# Patient Record
Sex: Male | Born: 1955 | Race: White | Hispanic: No | Marital: Single | Smoking: Current every day smoker
Health system: Southern US, Community
[De-identification: ages and names within clinical notes are randomized; demographics above are authoritative.]

## PROBLEM LIST (undated history)

## (undated) DIAGNOSIS — F419 Anxiety disorder, unspecified: Secondary | ICD-10-CM

## (undated) DIAGNOSIS — M549 Dorsalgia, unspecified: Secondary | ICD-10-CM

## (undated) DIAGNOSIS — H9190 Unspecified hearing loss, unspecified ear: Secondary | ICD-10-CM

## (undated) DIAGNOSIS — J302 Other seasonal allergic rhinitis: Secondary | ICD-10-CM

## (undated) DIAGNOSIS — G43909 Migraine, unspecified, not intractable, without status migrainosus: Secondary | ICD-10-CM

## (undated) DIAGNOSIS — I10 Essential (primary) hypertension: Secondary | ICD-10-CM

## (undated) HISTORY — PX: KNEE SURGERY: SHX244

## (undated) HISTORY — PX: FOOT FRACTURE SURGERY: SHX645

## (undated) HISTORY — PX: FACIAL FRACTURE SURGERY: SHX1570

---

## 1999-02-22 ENCOUNTER — Emergency Department (HOSPITAL_COMMUNITY): Admission: EM | Admit: 1999-02-22 | Discharge: 1999-02-22 | Payer: Self-pay | Admitting: Emergency Medicine

## 1999-03-27 ENCOUNTER — Emergency Department (HOSPITAL_COMMUNITY): Admission: EM | Admit: 1999-03-27 | Discharge: 1999-03-28 | Payer: Self-pay | Admitting: Emergency Medicine

## 2000-02-10 ENCOUNTER — Emergency Department (HOSPITAL_COMMUNITY): Admission: EM | Admit: 2000-02-10 | Discharge: 2000-02-10 | Payer: Self-pay | Admitting: Emergency Medicine

## 2000-03-26 ENCOUNTER — Encounter: Payer: Self-pay | Admitting: Emergency Medicine

## 2000-03-26 ENCOUNTER — Emergency Department (HOSPITAL_COMMUNITY): Admission: EM | Admit: 2000-03-26 | Discharge: 2000-03-26 | Payer: Self-pay | Admitting: Emergency Medicine

## 2000-05-20 ENCOUNTER — Emergency Department (HOSPITAL_COMMUNITY): Admission: EM | Admit: 2000-05-20 | Discharge: 2000-05-20 | Payer: Self-pay | Admitting: Emergency Medicine

## 2003-01-20 ENCOUNTER — Emergency Department (HOSPITAL_COMMUNITY): Admission: EM | Admit: 2003-01-20 | Discharge: 2003-01-20 | Payer: Self-pay | Admitting: Emergency Medicine

## 2003-12-17 ENCOUNTER — Emergency Department (HOSPITAL_COMMUNITY): Admission: EM | Admit: 2003-12-17 | Discharge: 2003-12-17 | Payer: Self-pay | Admitting: Emergency Medicine

## 2004-07-08 ENCOUNTER — Emergency Department (HOSPITAL_COMMUNITY): Admission: EM | Admit: 2004-07-08 | Discharge: 2004-07-08 | Payer: Self-pay | Admitting: Emergency Medicine

## 2005-08-10 ENCOUNTER — Emergency Department (HOSPITAL_COMMUNITY): Admission: EM | Admit: 2005-08-10 | Discharge: 2005-08-10 | Payer: Self-pay | Admitting: Emergency Medicine

## 2005-09-04 ENCOUNTER — Emergency Department: Payer: Self-pay | Admitting: Emergency Medicine

## 2006-12-17 HISTORY — PX: FRACTURE SURGERY: SHX138

## 2007-02-04 ENCOUNTER — Emergency Department (HOSPITAL_COMMUNITY): Admission: EM | Admit: 2007-02-04 | Discharge: 2007-02-04 | Payer: Self-pay | Admitting: Emergency Medicine

## 2007-02-07 ENCOUNTER — Encounter (HOSPITAL_COMMUNITY): Admission: RE | Admit: 2007-02-07 | Discharge: 2007-02-07 | Payer: Self-pay | Admitting: Emergency Medicine

## 2009-10-24 ENCOUNTER — Emergency Department (HOSPITAL_COMMUNITY): Admission: EM | Admit: 2009-10-24 | Discharge: 2009-10-24 | Payer: Self-pay | Admitting: Emergency Medicine

## 2011-03-14 ENCOUNTER — Emergency Department (HOSPITAL_COMMUNITY)
Admission: EM | Admit: 2011-03-14 | Discharge: 2011-03-14 | Disposition: A | Discharge status: Home / Self Care | Payer: Self-pay | Attending: Emergency Medicine | Admitting: Emergency Medicine

## 2011-03-14 DIAGNOSIS — M545 Low back pain, unspecified: Secondary | ICD-10-CM | POA: Insufficient documentation

## 2011-03-14 DIAGNOSIS — M549 Dorsalgia, unspecified: Secondary | ICD-10-CM | POA: Insufficient documentation

## 2012-10-14 ENCOUNTER — Emergency Department (HOSPITAL_COMMUNITY)
Admission: EM | Admit: 2012-10-14 | Discharge: 2012-10-14 | Disposition: A | Discharge status: Home / Self Care | Payer: Self-pay | Attending: Emergency Medicine | Admitting: Emergency Medicine

## 2012-10-14 ENCOUNTER — Encounter (HOSPITAL_COMMUNITY): Payer: Self-pay | Admitting: *Deleted

## 2012-10-14 DIAGNOSIS — F17200 Nicotine dependence, unspecified, uncomplicated: Secondary | ICD-10-CM | POA: Insufficient documentation

## 2012-10-14 DIAGNOSIS — Z9889 Other specified postprocedural states: Secondary | ICD-10-CM | POA: Insufficient documentation

## 2012-10-14 DIAGNOSIS — Y929 Unspecified place or not applicable: Secondary | ICD-10-CM | POA: Insufficient documentation

## 2012-10-14 DIAGNOSIS — S93409A Sprain of unspecified ligament of unspecified ankle, initial encounter: Secondary | ICD-10-CM | POA: Insufficient documentation

## 2012-10-14 DIAGNOSIS — X500XXA Overexertion from strenuous movement or load, initial encounter: Secondary | ICD-10-CM | POA: Insufficient documentation

## 2012-10-14 DIAGNOSIS — S93402A Sprain of unspecified ligament of left ankle, initial encounter: Secondary | ICD-10-CM

## 2012-10-14 DIAGNOSIS — Y939 Activity, unspecified: Secondary | ICD-10-CM | POA: Insufficient documentation

## 2012-10-14 MED ORDER — NAPROXEN 500 MG PO TABS: 500.0000 mg | ORAL_TABLET | Freq: Two times a day (BID) | ORAL | Status: DC

## 2012-10-14 NOTE — ED Provider Notes (Signed)
History     CSN: [id number]  Arrival date & time 10/14/12  1021   First MD Initiated Contact with Patient 10/14/12 1057      Chief Complaint  Patient presents with  . Ankle Pain    (Consider location/radiation/quality/duration/timing/severity/associated sxs/prior treatment) Patient is a 56 y.o. male presenting with ankle pain. The history is provided by the patient.  Ankle Pain  The incident occurred more than 2 days ago. The injury mechanism was torsion. The pain is present in the left ankle. The quality of the pain is described as aching. The pain is at a severity of 3/10. The pain is mild. The pain has been constant since onset. Pertinent negatives include no numbness, no inability to bear weight, no loss of motion, no muscle weakness, no loss of sensation and no tingling.  PT states twisted his left ankle 3 days ago. States pain, swelling, bruising. Was told by his job to get a clearance for work. Pt states hx of fracture on that ankle, had surgical repair. States pain with walking on it. Tried ice packs with some improvement.   History reviewed. No pertinent past medical history.  Past Surgical History  Procedure Date  . Fracture surgery 2008    L foot fx    No family history on file.  History  Substance Use Topics  . Smoking status: Current Every Day Smoker -- 1.0 packs/day    Types: Cigarettes  . Smokeless tobacco: Not on file  . Alcohol Use: No      Review of Systems  Constitutional: Negative for fever and chills.  Musculoskeletal: Positive for joint swelling and arthralgias. Negative for gait problem.  Neurological: Negative for tingling, weakness and numbness.    Allergies  Review of patient's allergies indicates no known allergies.  Home Medications   Current Outpatient Rx  Name Route Sig Dispense Refill  . ACETAMINOPHEN 500 MG PO TABS Oral Take 1,000 mg by mouth every 6 (six) hours as needed. pain      BP 116/73  Pulse 52  Temp 98 F (36.7 C)  (Oral)  Resp 18  SpO2 100%  Physical Exam  Nursing note and vitals reviewed. Constitutional: He is oriented to person, place, and time. He appears well-developed and well-nourished.  HENT:  Head: Normocephalic.  Eyes: Conjunctivae normal are normal.  Neck: Neck supple.  Cardiovascular: Normal rate, regular rhythm and normal heart sounds.   Pulmonary/Chest: Effort normal and breath sounds normal. No respiratory distress. He has no wheezes. He has no rales.  Musculoskeletal:       Left ankle mild swelling over lateral malleolus. Bruising noted over lateral malleolus. No tenderness with palpation. Full ROM. Joint stable.   Neurological: He is alert and oriented to person, place, and time.  Skin: Skin is warm and dry.  Psychiatric: He has a normal mood and affect.    ED Course  Procedures (including critical care time)  Left ankle sprain based on exam. No tenderness. Pt refusing x-ray, saying, "i know it is not broken" just wants a note to go back to work. Explained to him that i am unable to say for sure he does not have a fracture, he understands and still refuses x-ray. Pt is ambulatory, joint stable, non tender. Will d/c home with OK to go back to work as tolerates. Follow up with orthopedics as needed.    1. Sprain of ankle, left       MDM  Lottie Mussel, PA 10/14/12 1555

## 2012-10-14 NOTE — ED Notes (Signed)
Pt reports slipping on a board Friday, twisting his L ankle.  Pt reports that he's using cold compress to ankle.  Pt states "it's not broke i can tell you that."  But reports pain with weight bearing.

## 2012-10-14 NOTE — ED Notes (Signed)
Pt reports L ankle pain.  Twisting it Friday d/t fall.  No swelling noted at this time.  Pt reports pain is worse with weight bearing.  Pt's HR is also low at 52-pt reports this is normal for him.  Pt denies any lightheadedness, dizziness or cp or SOB at this time.

## 2012-10-18 NOTE — ED Provider Notes (Signed)
Medical screening examination/treatment/procedure(s) were performed by non-physician practitioner and as supervising physician I was immediately available for consultation/collaboration.   Suzi Roots, MD 10/18/12 [phone]

## 2014-08-07 ENCOUNTER — Encounter (HOSPITAL_COMMUNITY): Payer: Self-pay | Admitting: Emergency Medicine

## 2014-08-07 ENCOUNTER — Emergency Department (HOSPITAL_COMMUNITY)
Admission: EM | Admit: 2014-08-07 | Discharge: 2014-08-07 | Disposition: A | Discharge status: Home / Self Care | Payer: Self-pay | Attending: Emergency Medicine | Admitting: Emergency Medicine

## 2014-08-07 DIAGNOSIS — E860 Dehydration: Secondary | ICD-10-CM | POA: Insufficient documentation

## 2014-08-07 DIAGNOSIS — Z880 Allergy status to penicillin: Secondary | ICD-10-CM | POA: Insufficient documentation

## 2014-08-07 DIAGNOSIS — F17200 Nicotine dependence, unspecified, uncomplicated: Secondary | ICD-10-CM | POA: Insufficient documentation

## 2014-08-07 DIAGNOSIS — R112 Nausea with vomiting, unspecified: Secondary | ICD-10-CM | POA: Insufficient documentation

## 2014-08-07 LAB — URINALYSIS, ROUTINE W REFLEX MICROSCOPIC
Glucose, UA: NEGATIVE mg/dL
Ketones, ur: 15 mg/dL — AB
LEUKOCYTES UA: NEGATIVE
Nitrite: NEGATIVE
PROTEIN: NEGATIVE mg/dL
Specific Gravity, Urine: 1.038 — ABNORMAL HIGH (ref 1.005–1.030)
UROBILINOGEN UA: 0.2 mg/dL (ref 0.0–1.0)
pH: 5.5 (ref 5.0–8.0)

## 2014-08-07 LAB — CBC WITH DIFFERENTIAL/PLATELET
Basophils Absolute: 0 10*3/uL (ref 0.0–0.1)
Basophils Relative: 0 % (ref 0–1)
EOS ABS: 0 10*3/uL (ref 0.0–0.7)
EOS PCT: 0 % (ref 0–5)
HCT: 39.6 % (ref 39.0–52.0)
HEMOGLOBIN: 13.9 g/dL (ref 13.0–17.0)
LYMPHS ABS: 2 10*3/uL (ref 0.7–4.0)
Lymphocytes Relative: 28 % (ref 12–46)
MCH: 38.1 pg — AB (ref 26.0–34.0)
MCHC: 35.1 g/dL (ref 30.0–36.0)
MCV: 108.5 fL — ABNORMAL HIGH (ref 78.0–100.0)
MONOS PCT: 8 % (ref 3–12)
Monocytes Absolute: 0.5 10*3/uL (ref 0.1–1.0)
Neutro Abs: 4.6 10*3/uL (ref 1.7–7.7)
Neutrophils Relative %: 64 % (ref 43–77)
Platelets: 191 10*3/uL (ref 150–400)
RBC: 3.65 MIL/uL — AB (ref 4.22–5.81)
RDW: 14 % (ref 11.5–15.5)
WBC: 7.1 10*3/uL (ref 4.0–10.5)

## 2014-08-07 LAB — COMPREHENSIVE METABOLIC PANEL
ALT: 13 U/L (ref 0–53)
AST: 18 U/L (ref 0–37)
Albumin: 4.7 g/dL (ref 3.5–5.2)
Alkaline Phosphatase: 85 U/L (ref 39–117)
Anion gap: 17 — ABNORMAL HIGH (ref 5–15)
BUN: 25 mg/dL — AB (ref 6–23)
CALCIUM: 9.9 mg/dL (ref 8.4–10.5)
CO2: 26 mEq/L (ref 19–32)
Chloride: 100 mEq/L (ref 96–112)
Creatinine, Ser: 0.88 mg/dL (ref 0.50–1.35)
GFR calc non Af Amer: >90 mL/min (ref >90)
GLUCOSE: 111 mg/dL — AB (ref 70–99)
Potassium: 3.7 mEq/L (ref 3.7–5.3)
Sodium: 143 mEq/L (ref 137–147)
TOTAL PROTEIN: 7.7 g/dL (ref 6.0–8.3)
Total Bilirubin: 0.5 mg/dL (ref 0.3–1.2)

## 2014-08-07 LAB — URINE MICROSCOPIC-ADD ON

## 2014-08-07 LAB — LIPASE, BLOOD: Lipase: 29 U/L (ref 11–59)

## 2014-08-07 MED ORDER — SODIUM CHLORIDE 0.9 % IV BOLUS (SEPSIS)
1000.0000 mL | Freq: Once | INTRAVENOUS | Status: AC
  Administered 2014-08-07: 1000 mL via INTRAVENOUS

## 2014-08-07 MED ORDER — ONDANSETRON 4 MG PO TBDP: 4.0000 mg | ORAL_TABLET | Freq: Three times a day (TID) | ORAL | Status: DC | PRN

## 2014-08-07 NOTE — ED Provider Notes (Signed)
CSN: [id number]     Arrival date & time 08/07/14  1749 History   First MD Initiated Contact with Patient 08/07/14 2026     Chief Complaint  Patient presents with  . Fatigue  . Dizziness  . Nausea  . Emesis     (Consider location/radiation/quality/duration/timing/severity/associated sxs/prior Treatment) HPI Comments: Patient with no significant past medical history presents with complaint of nausea, vomiting, fatigue over the past 2 days. Patient states that the symptoms began yesterday morning. He was at work last evening and began having multiple episodes of vomiting which persisted overnight. He was unable to keep down solids or liquids. Patient has been able to drink some water today without vomiting. He did not have any pain with these symptoms in his chest or his abdomen. He did not have any bowel movements or diarrhea. Urination is normal. No other medical complaints. No treatments prior to arrival. Patient works in a very hot environment. 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 He states that he drinks a lot of water but has trouble staying hydrated because it is so hot. Last alcohol use was 1/2 shot one week ago. Denies heavy NSAID use. The onset of this condition was acute. The course is constant. Aggravating factors: none. Alleviating factors: none.    Patient is a 58 y.o. male presenting with dizziness and vomiting. The history is provided by the patient.  Dizziness Associated symptoms: nausea and vomiting   Associated symptoms: no chest pain, no diarrhea, no headaches and no shortness of breath   Emesis Associated symptoms: no abdominal pain, no diarrhea, no headaches, no myalgias and no sore throat     History reviewed. No pertinent past medical history. Past Surgical History  Procedure Laterality Date  . Fracture surgery  2008    L foot fx   No family history on file. History  Substance Use Topics  . Smoking  status: Current Every Day Smoker -- 1.00 packs/day    Types: Cigarettes  . Smokeless tobacco: Not on file  . Alcohol Use: No    Review of Systems  Constitutional: Negative for fever.  HENT: Negative for rhinorrhea and sore throat.   Eyes: Negative for redness.  Respiratory: Negative for cough and shortness of breath.   Cardiovascular: Negative for chest pain and leg swelling.  Gastrointestinal: Positive for nausea and vomiting. Negative for abdominal pain and diarrhea.  Genitourinary: Negative for dysuria.  Musculoskeletal: Negative for myalgias.  Skin: Negative for rash.  Neurological: Negative for dizziness and headaches.   Allergies  Penicillins  Home Medications   Prior to Admission medications   Medication Sig Start Date End Date Taking? Authorizing Provider  ALPRAZolam Prudy Feeler) 0.25 MG tablet Take 0.5 mg by mouth at bedtime as needed for anxiety or sleep.   Yes Historical Provider, MD  diphenhydramine-acetaminophen (TYLENOL PM) 25-500 MG TABS Take 1 tablet by mouth at bedtime as needed (pain/sleep).   Yes Historical Provider, MD   BP 123/83  Pulse 86  Temp(Src) 98.4 F (36.9 C) (Oral)  Resp 16  Ht 6\' 1"  (1.854 m)  Wt 140 lb (63.504 kg)  BMI 18.47 kg/m2  SpO2 98%  Physical Exam  Nursing note and vitals reviewed. Constitutional: He appears well-developed and well-nourished.  HENT:  Head: Normocephalic and atraumatic.  Mouth/Throat: Mucous membranes are dry.  Eyes: Conjunctivae are normal. Right eye exhibits no discharge. Left eye exhibits no discharge.  Neck: Normal range of motion.  Neck supple.  Cardiovascular: Normal rate, regular rhythm and intact distal pulses.   No murmur heard. Pulmonary/Chest: Effort normal and breath sounds normal. No respiratory distress. He has no wheezes. He has no rales.  Abdominal: Soft. Bowel sounds are normal. There is no tenderness. There is no rebound and no guarding.  Neurological: He is alert.  Skin: Skin is warm and dry.   Psychiatric: He has a normal mood and affect.    ED Course  Procedures (including critical care time) Labs Review Labs Reviewed  CBC WITH DIFFERENTIAL - Abnormal; Notable for the following:    RBC 3.65 (*)    MCV 108.5 (*)    MCH 38.1 (*)    All other components within normal limits  COMPREHENSIVE METABOLIC PANEL - Abnormal; Notable for the following:    Glucose, Bld 111 (*)    BUN 25 (*)    Anion gap 17 (*)    All other components within normal limits  URINALYSIS, ROUTINE W REFLEX MICROSCOPIC - Abnormal; Notable for the following:    Color, Urine AMBER (*)    APPearance CLOUDY (*)    Specific Gravity, Urine 1.038 (*)    Hgb urine dipstick TRACE (*)    Bilirubin Urine SMALL (*)    Ketones, ur 15 (*)    All other components within normal limits  URINE MICROSCOPIC-ADD ON - Abnormal; Notable for the following:    Casts HYALINE CASTS (*)    All other components within normal limits  LIPASE, BLOOD    Imaging Review No results found.   EKG Interpretation None      9:08 PM Patient seen and examined. Work-up initiated. Fluids ordered. Considered imaging however no risk factors for obstruction or abdominal/chest pain.   Vital signs reviewed and are as follows: BP 123/83  Pulse 86  Temp(Src) 98.4 F (36.9 C) (Oral)  Resp 16  Ht 6\' 1"  (1.854 m)  Wt 140 lb (63.504 kg)  BMI 18.47 kg/m2  SpO2 98%  11:14 PM Patient feels much better after 2L NS. His UA suggests dehydration. He is tolerating PO fluids as well. Abd remains soft and NT. I feel the patient is safe to go home. I have encouraged patient to increase his fluid intake and rest for the next 2 days.  Patient urged to return with worsening symptoms or other concerns. Patient verbalized understanding and agrees with plan.    MDM   Final diagnoses:  Dehydration   Patient with N/V, fatigue likely 2/2 heat exhaustion-type illness. Patient is improved in emergency department after treatment. Labs suggest dehydration.  Patient does not have any chest pain or abdominal pain to suggest a more serious etiology. Patient appears well and is more energetic at time of discharge. He is comfortable with discharge home.  No dangerous or life-threatening conditions suspected or identified by history, physical exam, and by work-up. No indications for hospitalization identified.      Renne CriglerJoshua Shammara Jarrett, PA-C 08/07/14 2317

## 2014-08-07 NOTE — ED Notes (Signed)
Patient states he has been having N/V and overall weakness the last 2 days. Patient states this all come on at one time. Patient denies pain and has not taken anything for these symptoms.

## 2014-08-07 NOTE — Discharge Instructions (Signed)
Please read and follow all provided instructions.  Your diagnoses today include:  1. Dehydration     Tests performed today include:  Blood counts and electrolytes  Urine test - shows dehydration  Vital signs. See below for your results today.   Medications prescribed:   Zofran (ondansetron) - for nausea and vomiting  Take any prescribed medications only as directed.  Home care instructions:  Follow any educational materials contained in this packet.  BE VERY CAREFUL not to take multiple medicines containing Tylenol (also called acetaminophen). Doing so can lead to an overdose which can damage your liver and cause liver failure and possibly death.   Follow-up instructions: Please follow-up with your primary care provider in the next 3 days for further evaluation of your symptoms.   Double your fluid intake for the next 3 days.   Return instructions:   Please return to the Emergency Department if you experience worsening symptoms.   Please return if you have any other emergent concerns.  Additional Information:  Your vital signs today were: BP 149/84   Pulse 69   Temp(Src) 98 F (36.7 C) (Oral)   Resp 16   Ht 6\' 1"  (1.854 m)   Wt 140 lb (63.504 kg)   BMI 18.47 kg/m2   SpO2 100% If your blood pressure (BP) was elevated above 135/85 this visit, please have this repeated by your doctor within one month. --------------

## 2014-08-07 NOTE — ED Provider Notes (Signed)
Medical screening examination/treatment/procedure(s) were performed by non-physician practitioner and as supervising physician I was immediately available for consultation/collaboration.   EKG Interpretation None        Jaivion Kingsley, MD 08/07/14 2352 

## 2015-03-08 ENCOUNTER — Encounter (HOSPITAL_BASED_OUTPATIENT_CLINIC_OR_DEPARTMENT_OTHER): Payer: Self-pay | Admitting: *Deleted

## 2015-03-08 ENCOUNTER — Emergency Department (HOSPITAL_BASED_OUTPATIENT_CLINIC_OR_DEPARTMENT_OTHER)
Admission: EM | Admit: 2015-03-08 | Discharge: 2015-03-08 | Disposition: A | Discharge status: Home / Self Care | Payer: Self-pay | Attending: Emergency Medicine | Admitting: Emergency Medicine

## 2015-03-08 DIAGNOSIS — K047 Periapical abscess without sinus: Secondary | ICD-10-CM | POA: Insufficient documentation

## 2015-03-08 DIAGNOSIS — K088 Other specified disorders of teeth and supporting structures: Secondary | ICD-10-CM | POA: Insufficient documentation

## 2015-03-08 DIAGNOSIS — Z720 Tobacco use: Secondary | ICD-10-CM | POA: Insufficient documentation

## 2015-03-08 DIAGNOSIS — Z792 Long term (current) use of antibiotics: Secondary | ICD-10-CM | POA: Insufficient documentation

## 2015-03-08 DIAGNOSIS — K0889 Other specified disorders of teeth and supporting structures: Secondary | ICD-10-CM

## 2015-03-08 DIAGNOSIS — Z79899 Other long term (current) drug therapy: Secondary | ICD-10-CM | POA: Insufficient documentation

## 2015-03-08 DIAGNOSIS — Z880 Allergy status to penicillin: Secondary | ICD-10-CM | POA: Insufficient documentation

## 2015-03-08 MED ORDER — CLINDAMYCIN HCL 300 MG PO CAPS: 300.0000 mg | ORAL_CAPSULE | Freq: Four times a day (QID) | ORAL | Status: DC

## 2015-03-08 MED ORDER — IBUPROFEN 800 MG PO TABS: 800.0000 mg | ORAL_TABLET | Freq: Three times a day (TID) | ORAL | Status: DC

## 2015-03-08 NOTE — ED Notes (Signed)
Pt amb to room 10 with quick steady gait in nad. Pt states he knows he has 6 "rotten teeth" that "need to come out". Pt states he called dentist this am and was told to come to ed. Pt reports left lower tooth has increased pain "pressure" that is causing a headache.

## 2015-03-08 NOTE — ED Provider Notes (Signed)
CSN: [id number]     Arrival date & time 03/08/15  1104 History   First MD Initiated Contact with Patient 03/08/15 1208     Chief Complaint  Patient presents with  . Dental Pain     (Consider location/radiation/quality/duration/timing/severity/associated sxs/prior Treatment) HPI Comments: 59 y/o M complaining of dental pain "for a while", worsening over the past 2 days. Reports he has 6 "rotten teeth" the need to come out, however he is unable to afford it at the time he saw the dentist. He tried calling his dentist office this morning, and was told his dentist will be back in the country April 1, and in the meantime to go to the ED. States he feels pressure behind the tooth that is hurting, worse when he presses on the area. No alleviating factors. States it is slightly swollen. Denies fevers.  Patient is a 59 y.o. male presenting with tooth pain. The history is provided by the patient.  Dental Pain   History reviewed. No pertinent past medical history. Past Surgical History  Procedure Laterality Date  . Fracture surgery  2008    L foot fx   History reviewed. No pertinent family history. History  Substance Use Topics  . Smoking status: Current Every Day Smoker -- 1.00 packs/day    Types: Cigarettes  . Smokeless tobacco: Not on file  . Alcohol Use: No    Review of Systems  HENT: Positive for dental problem.   All other systems reviewed and are negative.     Allergies  Penicillins  Home Medications   Prior to Admission medications   Medication Sig Start Date End Date Taking? Authorizing Provider  ALPRAZolam Prudy Feeler(XANAX) 0.25 MG tablet Take 0.5 mg by mouth at bedtime as needed for anxiety or sleep.    Historical Provider, MD  clindamycin (CLEOCIN) 300 MG capsule Take 1 capsule (300 mg total) by mouth 4 (four) times daily. X 7 days 03/08/15   Kathrynn Speedobyn M Zhaniya Swallows, PA-C  diphenhydramine-acetaminophen (TYLENOL PM) 25-500 MG TABS Take 1 tablet by mouth at bedtime as needed (pain/sleep).     Historical Provider, MD  ibuprofen (ADVIL,MOTRIN) 800 MG tablet Take 1 tablet (800 mg total) by mouth 3 (three) times daily. 03/08/15   Nada Boozerobyn M Emberly Tomasso, PA-C  ondansetron (ZOFRAN ODT) 4 MG disintegrating tablet Take 1 tablet (4 mg total) by mouth every 8 (eight) hours as needed for nausea or vomiting. 08/07/14   Renne CriglerJoshua Geiple, PA-C   BP 153/92 mmHg  Pulse 84  Temp(Src) 98.4 F (36.9 C) (Oral)  Resp 18  Ht 6\' 1"  (1.854 m)  Wt 140 lb (63.504 kg)  BMI 18.47 kg/m2  SpO2 99% Physical Exam  Constitutional: He is oriented to person, place, and time. He appears well-developed and well-nourished. No distress.  HENT:  Head: Normocephalic and atraumatic.  Mouth/Throat:    Poor dentition. Multiple missing teeth and significant tooth decay.  Eyes: Conjunctivae and EOM are normal.  Neck: Normal range of motion. Neck supple.  Cardiovascular: Normal rate, regular rhythm and normal heart sounds.   Pulmonary/Chest: Effort normal and breath sounds normal.  Musculoskeletal: Normal range of motion. He exhibits no edema.  Lymphadenopathy:    He has no cervical adenopathy.  Neurological: He is alert and oriented to person, place, and time.  Skin: Skin is warm and dry.  Psychiatric: He has a normal mood and affect. His behavior is normal.  Nursing note and vitals reviewed.   ED Course  Procedures (including critical care time) Labs Review Labs  Reviewed - No data to display  Imaging Review No results found.   EKG Interpretation None      MDM   Final diagnoses:  Dental abscess  Pain, dental   NAD. AFVSS. Tx w [place]. F/u with dentist. Smoking cessation highly encouraged. No evidence of Ludwig's angina. Stable for discharge. Return precautions given. Patient states understanding of treatment care plan and is agreeable.  Kathrynn Speed, PA-C 03/08/15 1227  Glynn Octave, MD 03/08/15 [phone]

## 2015-03-08 NOTE — Discharge Instructions (Signed)
Take clindamycin as directed for 7 days. Take ibuprofen as directed for pain. Follow up with your dentist as soon as possible.  Dental Abscess A dental abscess is a collection of infected fluid (pus) from a bacterial infection in the inner part of the tooth (pulp). It usually occurs at the end of the tooth's root.  CAUSES   Severe tooth decay.  Trauma to the tooth that allows bacteria to enter into the pulp, such as a broken or chipped tooth. SYMPTOMS   Severe pain in and around the infected tooth.  Swelling and redness around the abscessed tooth or in the mouth or face.  Tenderness.  Pus drainage.  Bad breath.  Bitter taste in the mouth.  Difficulty swallowing.  Difficulty opening the mouth.  Nausea.  Vomiting.  Chills.  Swollen neck glands. DIAGNOSIS   A medical and dental history will be taken.  An examination will be performed by tapping on the abscessed tooth.  X-rays may be taken of the tooth to identify the abscess. TREATMENT The goal of treatment is to eliminate the infection. You may be prescribed antibiotic medicine to stop the infection from spreading. A root canal may be performed to save the tooth. If the tooth cannot be saved, it may be pulled (extracted) and the abscess may be drained.  HOME CARE INSTRUCTIONS  Only take over-the-counter or prescription medicines for pain, fever, or discomfort as directed by your caregiver.  Rinse your mouth (gargle) often with salt water ( tsp salt in 8 oz [250 ml] of warm water) to relieve pain or swelling.  Do not drive after taking pain medicine (narcotics).  Do not apply heat to the outside of your face.  Return to your dentist for further treatment as directed. SEEK MEDICAL CARE IF:  Your pain is not helped by medicine.  Your pain is getting worse instead of better. SEEK IMMEDIATE MEDICAL CARE IF:  You have a fever or persistent symptoms for more than 2-3 days.  You have a fever and your symptoms  suddenly get worse.  You have chills or a very bad headache.  You have problems breathing or swallowing.  You have trouble opening your mouth.  You have swelling in the neck or around the eye. Document Released: 12/03/2005 Document Revised: 08/27/2012 Document Reviewed: 03/13/2011 [facility] Patient Information 2015 [place], [place]. This information is not intended to replace advice given to you by your health care provider. Make sure you discuss any questions you have with your health care provider.

## 2015-03-14 ENCOUNTER — Emergency Department (HOSPITAL_COMMUNITY)
Admission: EM | Admit: 2015-03-14 | Discharge: 2015-03-14 | Disposition: A | Discharge status: Home / Self Care | Payer: Self-pay | Attending: Emergency Medicine | Admitting: Emergency Medicine

## 2015-03-14 ENCOUNTER — Encounter (HOSPITAL_COMMUNITY): Payer: Self-pay | Admitting: Emergency Medicine

## 2015-03-14 DIAGNOSIS — Z880 Allergy status to penicillin: Secondary | ICD-10-CM | POA: Insufficient documentation

## 2015-03-14 DIAGNOSIS — R110 Nausea: Secondary | ICD-10-CM | POA: Insufficient documentation

## 2015-03-14 DIAGNOSIS — S3992XA Unspecified injury of lower back, initial encounter: Secondary | ICD-10-CM | POA: Insufficient documentation

## 2015-03-14 DIAGNOSIS — X58XXXA Exposure to other specified factors, initial encounter: Secondary | ICD-10-CM | POA: Insufficient documentation

## 2015-03-14 DIAGNOSIS — Y998 Other external cause status: Secondary | ICD-10-CM | POA: Insufficient documentation

## 2015-03-14 DIAGNOSIS — Z79899 Other long term (current) drug therapy: Secondary | ICD-10-CM | POA: Insufficient documentation

## 2015-03-14 DIAGNOSIS — Z720 Tobacco use: Secondary | ICD-10-CM | POA: Insufficient documentation

## 2015-03-14 DIAGNOSIS — Y9289 Other specified places as the place of occurrence of the external cause: Secondary | ICD-10-CM | POA: Insufficient documentation

## 2015-03-14 DIAGNOSIS — M5442 Lumbago with sciatica, left side: Secondary | ICD-10-CM | POA: Insufficient documentation

## 2015-03-14 DIAGNOSIS — Y9389 Activity, other specified: Secondary | ICD-10-CM | POA: Insufficient documentation

## 2015-03-14 DIAGNOSIS — M62830 Muscle spasm of back: Secondary | ICD-10-CM

## 2015-03-14 MED ORDER — DIAZEPAM 5 MG PO TABS
5.0000 mg | ORAL_TABLET | Freq: Once | ORAL | Status: AC
  Administered 2015-03-14: 5 mg via ORAL
  Filled 2015-03-14: qty 1

## 2015-03-14 MED ORDER — OXYCODONE-ACETAMINOPHEN 5-325 MG PO TABS
1.0000 | ORAL_TABLET | Freq: Once | ORAL | Status: AC
  Administered 2015-03-14: 1 via ORAL
  Filled 2015-03-14: qty 1

## 2015-03-14 MED ORDER — HYDROCODONE-ACETAMINOPHEN 5-325 MG PO TABS: 1.0000 | ORAL_TABLET | ORAL | Status: DC | PRN

## 2015-03-14 MED ORDER — IBUPROFEN 200 MG PO TABS
600.0000 mg | ORAL_TABLET | Freq: Once | ORAL | Status: AC
  Administered 2015-03-14: 600 mg via ORAL
  Filled 2015-03-14: qty 3

## 2015-03-14 MED ORDER — DIAZEPAM 5 MG PO TABS: 5.0000 mg | ORAL_TABLET | Freq: Two times a day (BID) | ORAL | Status: DC

## 2015-03-14 MED ORDER — IBUPROFEN 600 MG PO TABS: 600.0000 mg | ORAL_TABLET | Freq: Four times a day (QID) | ORAL | Status: DC | PRN

## 2015-03-14 NOTE — ED Notes (Signed)
Pt c/o lower back pain, stating that he twisted the left side.

## 2015-03-14 NOTE — ED Provider Notes (Signed)
CSN: [id number]     Arrival date & time 03/14/15  1633 History  This chart was scribed for non-physician practitioner, Junius Finner, PA-C,working with Blake Divine, MD, by Karle Plumber, ED Scribe. This patient was seen in room WTR9/WTR9 and the patient's care was started at 6:08 PM.  Chief Complaint  Patient presents with  . Back Pain   Patient is a 59 y.o. male presenting with back pain. The history is provided by the patient and medical records. No language interpreter was used.  Back Pain Associated symptoms: numbness   Associated symptoms: no fever and no weakness     HPI Comments:  Cameron [name] is a 59 y.o. male who presents to the Emergency Department complaining of severe, lower back pain that began earlier this morning. Pt states he was getting in and out of a truck this morning while working and must have twisted the wrong way. He states he is experiencing left leg numbness. Pain is 10/10 at worst.  He has used a muscle rub with no relief of the pain. Touching the area or moving makes the pain worse. Denies alleviating factors. Denies weakness or tingling of the lower extremities, loss of bowel or bladder function, saddle anesthesia, bruising, wounds, fever, chills, nausea or vomiting. Pt denies ever having back surgery in the past. He does not have a PCP but sometimes goes to an urgent care and sees Dr. Ashley Royalty.  History reviewed. No pertinent past medical history. Past Surgical History  Procedure Laterality Date  . Fracture surgery  2008    L foot fx   No family history on file. History  Substance Use Topics  . Smoking status: Current Every Day Smoker -- 1.00 packs/day    Types: Cigarettes  . Smokeless tobacco: Not on file  . Alcohol Use: No    Review of Systems  Constitutional: Negative for fever and chills.  Gastrointestinal: Positive for nausea. Negative for vomiting.  Genitourinary:       No bowel or bladder incontinence.  Musculoskeletal: Positive for back  pain.  Skin: Negative for color change and wound.  Neurological: Positive for numbness. Negative for weakness.  All other systems reviewed and are negative.   Allergies  Penicillins  Home Medications   Prior to Admission medications   Medication Sig Start Date End Date Taking? Authorizing Provider  clindamycin (CLEOCIN) 300 MG capsule Take 1 capsule (300 mg total) by mouth 4 (four) times daily. X 7 days 03/08/15  Yes Robyn M Hess, PA-C  Multiple Vitamin (MULTIVITAMIN WITH MINERALS) TABS tablet Take 1 tablet by mouth daily.   Yes Historical Provider, MD  ALPRAZolam Prudy Feeler) 0.25 MG tablet Take 0.5 mg by mouth at bedtime as needed for anxiety or sleep.    Historical Provider, MD  diazepam (VALIUM) 5 MG tablet Take 1 tablet (5 mg total) by mouth 2 (two) times daily. 03/14/15   Junius Finner, PA-C  diphenhydramine-acetaminophen (TYLENOL PM) 25-500 MG TABS Take 1 tablet by mouth at bedtime as needed (pain/sleep).    Historical Provider, MD  HYDROcodone-acetaminophen (NORCO/VICODIN) 5-325 MG per tablet Take 1-2 tablets by mouth every 4 (four) hours as needed. 03/14/15   Junius Finner, PA-C  ibuprofen (ADVIL,MOTRIN) 600 MG tablet Take 1 tablet (600 mg total) by mouth every 6 (six) hours as needed. 03/14/15   Junius Finner, PA-C  ondansetron (ZOFRAN ODT) 4 MG disintegrating tablet Take 1 tablet (4 mg total) by mouth every 8 (eight) hours as needed for nausea or vomiting. Patient not taking:  Reported on 03/14/2015 08/07/14   Renne CriglerJoshua Geiple, PA-C   Triage Vitals: BP 150/97 mmHg  Pulse 86  Temp(Src) 97.8 F (36.6 C) (Oral)  Resp 18  SpO2 100% Physical Exam  Constitutional: He is oriented to person, place, and time. He appears well-developed and well-nourished. He appears distressed.  Thin male sitting in exam chair, appears older than stated age. Pt hold left side of lower back, appears uncomfortable.   HENT:  Head: Normocephalic and atraumatic.  Eyes: EOM are normal.  Neck: Normal range of motion.   Cardiovascular: Normal rate.   Pulmonary/Chest: Effort normal.  Musculoskeletal: Normal range of motion. He exhibits tenderness.  No midline spinal tenderness. Tenderness to left lower lumbar muscles and left buttock.  Neurological: He is alert and oriented to person, place, and time.  Antalgic gait.  Skin: Skin is warm and dry.  Psychiatric: He has a normal mood and affect. His behavior is normal.  Nursing note and vitals reviewed.   ED Course  Procedures (including critical care time) DIAGNOSTIC STUDIES: Oxygen Saturation is 100% on RA, normal by my interpretation.   COORDINATION OF CARE: 6:12 PM- Will prescribe pain medication and muscle relaxer. Will provide work note and give first dose of pain medication prior to discharge. Pt verbalizes understanding and agrees to plan.  Medications  diazepam (VALIUM) tablet 5 mg (5 mg Oral Given 03/14/15 1826)  oxyCODONE-acetaminophen (PERCOCET/ROXICET) 5-325 MG per tablet 1 tablet (1 tablet Oral Given 03/14/15 1826)  ibuprofen (ADVIL,MOTRIN) tablet 600 mg (600 mg Oral Given 03/14/15 1825)    Labs Review Labs Reviewed - No data to display  Imaging Review No results found.   EKG Interpretation None      MDM   Final diagnoses:  Left-sided low back pain with left-sided sciatica  Back muscle spasm    Pt presenting to ED with c/o left lower back pain radiating down Left leg after twisting motion getting into and out of his truck this morning. No red flag symptoms.  Do not believe imaging needed at this time. Not concerned for emergent process taking place. Will tx symptomatically as needed for pain. Rx: ibuprofen, norco, and valium. Home care instructions provided. Return precautions provided. Pt verbalized understanding and agreement with tx plan.   I personally performed the services described in this documentation, which was scribed in my presence. The recorded information has been reviewed and is accurate.    Junius Finnerrin O'Malley,  PA-C 03/15/15 1542  Blake DivineJohn Wofford, MD 03/15/15 2350

## 2015-04-02 ENCOUNTER — Emergency Department (HOSPITAL_COMMUNITY): Payer: Self-pay

## 2015-04-02 ENCOUNTER — Emergency Department (HOSPITAL_COMMUNITY)
Admission: EM | Admit: 2015-04-02 | Discharge: 2015-04-02 | Disposition: A | Discharge status: Home / Self Care | Payer: Self-pay | Attending: Emergency Medicine | Admitting: Emergency Medicine

## 2015-04-02 ENCOUNTER — Encounter (HOSPITAL_COMMUNITY): Payer: Self-pay | Admitting: Emergency Medicine

## 2015-04-02 DIAGNOSIS — Z79899 Other long term (current) drug therapy: Secondary | ICD-10-CM | POA: Insufficient documentation

## 2015-04-02 DIAGNOSIS — M549 Dorsalgia, unspecified: Secondary | ICD-10-CM

## 2015-04-02 DIAGNOSIS — Z720 Tobacco use: Secondary | ICD-10-CM | POA: Insufficient documentation

## 2015-04-02 DIAGNOSIS — Z87828 Personal history of other (healed) physical injury and trauma: Secondary | ICD-10-CM | POA: Insufficient documentation

## 2015-04-02 DIAGNOSIS — Z880 Allergy status to penicillin: Secondary | ICD-10-CM | POA: Insufficient documentation

## 2015-04-02 DIAGNOSIS — M545 Low back pain: Secondary | ICD-10-CM | POA: Insufficient documentation

## 2015-04-02 MED ORDER — ORPHENADRINE CITRATE ER 100 MG PO TB12: 100.0000 mg | ORAL_TABLET | Freq: Two times a day (BID) | ORAL | Status: DC

## 2015-04-02 MED ORDER — TRAMADOL HCL 50 MG PO TABS: 50.0000 mg | ORAL_TABLET | Freq: Four times a day (QID) | ORAL | Status: DC | PRN

## 2015-04-02 MED ORDER — KETOROLAC TROMETHAMINE 60 MG/2ML IM SOLN
30.0000 mg | Freq: Once | INTRAMUSCULAR | Status: AC
  Administered 2015-04-02: 30 mg via INTRAMUSCULAR
  Filled 2015-04-02: qty 2

## 2015-04-02 NOTE — ED Notes (Signed)
Pt reports he was just here for same and the MD did not give him the correct medications to "knock out the pain." Pt states pain radiates from lower back to groin.

## 2015-04-02 NOTE — ED Provider Notes (Signed)
CSN: [id number]     Arrival date & time 04/02/15  0016 History   First MD Initiated Contact with Patient 04/02/15 0144     Chief Complaint  Patient presents with  . Back Pain     (Consider location/radiation/quality/duration/timing/severity/associated sxs/prior Treatment) Patient is a 59 y.o. male presenting with back pain.  Back Pain Location:  Sacro-iliac joint and lumbar spine Quality:  Stabbing and cramping Radiates to: bil le. Pain severity:  Severe Pain is:  Same all the time Onset quality:  Gradual Duration:  2 weeks Timing:  Constant Progression:  Unchanged Chronicity:  New Context: lifting heavy objects   Relieved by:  Nothing Worsened by:  Movement, sitting, palpation, touching and twisting Associated symptoms: no bladder incontinence, no bowel incontinence, no numbness, no paresthesias, no perianal numbness and no weakness     History reviewed. No pertinent past medical history. Past Surgical History  Procedure Laterality Date  . Fracture surgery  2008    L foot fx   History reviewed. No pertinent family history. History  Substance Use Topics  . Smoking status: Current Every Day Smoker -- 1.00 packs/day    Types: Cigarettes  . Smokeless tobacco: Not on file  . Alcohol Use: No    Review of Systems  Gastrointestinal: Negative for bowel incontinence.  Genitourinary: Negative for bladder incontinence.  Musculoskeletal: Positive for back pain.  Neurological: Negative for weakness, numbness and paresthesias.  All other systems reviewed and are negative.     Allergies  Penicillins  Home Medications   Prior to Admission medications   Medication Sig Start Date End Date Taking? Authorizing Provider  diazepam (VALIUM) 5 MG tablet Take 1 tablet (5 mg total) by mouth 2 (two) times daily. 03/14/15  Yes Junius Finner, PA-C  diphenhydramine-acetaminophen (TYLENOL PM) 25-500 MG TABS Take 1 tablet by mouth at bedtime as needed (pain/sleep).   Yes Historical  Provider, MD  HYDROcodone-acetaminophen (NORCO/VICODIN) 5-325 MG per tablet Take 1-2 tablets by mouth every 4 (four) hours as needed. 03/14/15  Yes Junius Finner, PA-C  ALPRAZolam Prudy Feeler) 0.25 MG tablet Take 0.5 mg by mouth at bedtime as needed for anxiety or sleep.    Historical Provider, MD  clindamycin (CLEOCIN) 300 MG capsule Take 1 capsule (300 mg total) by mouth 4 (four) times daily. X 7 days Patient not taking: Reported on 04/02/2015 03/08/15   Kathrynn Speed, PA-C  ibuprofen (ADVIL,MOTRIN) 600 MG tablet Take 1 tablet (600 mg total) by mouth every 6 (six) hours as needed. Patient taking differently: Take 600 mg by mouth every 6 (six) hours as needed for moderate pain.  03/14/15   Junius Finner, PA-C  Multiple Vitamin (MULTIVITAMIN WITH MINERALS) TABS tablet Take 1 tablet by mouth daily.    Historical Provider, MD  ondansetron (ZOFRAN ODT) 4 MG disintegrating tablet Take 1 tablet (4 mg total) by mouth every 8 (eight) hours as needed for nausea or vomiting. Patient not taking: Reported on 03/14/2015 08/07/14   Renne Crigler, PA-C  orphenadrine (NORFLEX) 100 MG tablet Take 1 tablet (100 mg total) by mouth 2 (two) times daily. 04/02/15   Mirian Mo, MD  traMADol (ULTRAM) 50 MG tablet Take 1 tablet (50 mg total) by mouth every 6 (six) hours as needed for moderate pain. 04/02/15   Mirian Mo, MD   BP 161/104 mmHg  Pulse 77  Temp(Src) 97.6 F (36.4 C) (Oral)  Resp 16  SpO2 100% Physical Exam  Constitutional: He is oriented to person, place, and time. He appears well-developed  and well-nourished.  HENT:  Head: Normocephalic and atraumatic.  Eyes: Conjunctivae and EOM are normal.  Neck: Normal range of motion. Neck supple.  Cardiovascular: Normal rate, regular rhythm and normal heart sounds.   Pulmonary/Chest: Effort normal and breath sounds normal. No respiratory distress.  Abdominal: He exhibits no distension. There is no tenderness. There is no rebound and no guarding.  Musculoskeletal:  Normal range of motion.       Cervical back: Normal.       Thoracic back: Normal.       Lumbar back: He exhibits tenderness and bony tenderness.  Neurological: He is alert and oriented to person, place, and time.  No neuro deficit in legs  Skin: Skin is warm and dry.  Vitals reviewed.   ED Course  Procedures (including critical care time) Labs Review Labs Reviewed - No data to display  Imaging Review Dg Lumbar Spine Complete  04/02/2015   CLINICAL DATA:  Back pain. Twisting back today. Pain radiates from low back to groin.  EXAM: LUMBAR SPINE - COMPLETE 4+ VIEW  COMPARISON:  None.  FINDINGS: There are 6 non-rib-bearing lumbar vertebra, the lower most vertebra will be designated L5. The alignment is maintained. Vertebral body heights are normal. There is no listhesis. The posterior elements are intact. Mild disc space narrowing at L5-S1 and L3-L4. Mild diffuse spondylosis with endplate spurring. No fracture. Sacroiliac joints are symmetric and normal. Atherosclerosis noted of the abdominal aorta.  IMPRESSION: Mild spondylosis in degenerative disc disease. No acute bony abnormality. Incidental note of 6 non-rib-bearing lumbar vertebra.   Electronically Signed   By: Rubye OaksMelanie  Ehinger M.D.   On: 04/02/2015 03:07     EKG Interpretation None      MDM   Final diagnoses:  Back pain    59 y.o. male with pertinent PMH of recent visit for lumbar strain presents with continued crampy lumbar pain (L>R).  Physical exam and history reassuring, likely sciatica.  XR obtained and unremarkable.  Discussed departmental narcotic policy with pt.  DC with tramadol and norflex, as well as standard return precautions.    I have reviewed all laboratory and imaging studies if ordered as above  1. Back pain         Mirian MoMatthew Gentry, MD 04/02/15 [phone]

## 2015-04-02 NOTE — Discharge Instructions (Signed)
Back Pain, Adult Low back pain is very common. About 1 in 5 people have back pain.The cause of low back pain is rarely dangerous. The pain often gets better over time.About half of people with a sudden onset of back pain feel better in just 2 weeks. About 8 in 10 people feel better by 6 weeks.  CAUSES Some common causes of back pain include:  Strain of the muscles or ligaments supporting the spine.  Wear and tear (degeneration) of the spinal discs.  Arthritis.  Direct injury to the back. DIAGNOSIS Most of the time, the direct cause of low back pain is not known.However, back pain can be treated effectively even when the exact cause of the pain is unknown.Answering your caregiver's questions about your overall health and symptoms is one of the most accurate ways to make sure the cause of your pain is not dangerous. If your caregiver needs more information, he or she may order lab work or imaging tests (X-rays or MRIs).However, even if imaging tests show changes in your back, this usually does not require surgery. HOME CARE INSTRUCTIONS For many people, back pain returns.Since low back pain is rarely dangerous, it is often a condition that people can learn to manageon their own.   Remain active. It is stressful on the back to sit or stand in one place. Do not sit, drive, or stand in one place for more than 30 minutes at a time. Take short walks on level surfaces as soon as pain allows.Try to increase the length of time you walk each day.  Do not stay in bed.Resting more than 1 or 2 days can delay your recovery.  Do not avoid exercise or work.Your body is made to move.It is not dangerous to be active, even though your back may hurt.Your back will likely heal faster if you return to being active before your pain is gone.  Pay attention to your body when you bend and lift. Many people have less discomfortwhen lifting if they bend their knees, keep the load close to their bodies,and  avoid twisting. Often, the most comfortable positions are those that put less stress on your recovering back.  Find a comfortable position to sleep. Use a firm mattress and lie on your side with your knees slightly bent. If you lie on your back, put a pillow under your knees.  Only take over-the-counter or prescription medicines as directed by your caregiver. Over-the-counter medicines to reduce pain and inflammation are often the most helpful.Your caregiver may prescribe muscle relaxant drugs.These medicines help dull your pain so you can more quickly return to your normal activities and healthy exercise.  Put ice on the injured area.  Put ice in a plastic bag.  Place a towel between your skin and the bag.  Leave the ice on for 15-20 minutes, 03-04 times a day for the first 2 to 3 days. After that, ice and heat may be alternated to reduce pain and spasms.  Ask your caregiver about trying back exercises and gentle massage. This may be of some benefit.  Avoid feeling anxious or stressed.Stress increases muscle tension and can worsen back pain.It is important to recognize when you are anxious or stressed and learn ways to manage it.Exercise is a great option. SEEK MEDICAL CARE IF:  You have pain that is not relieved with rest or medicine.  You have pain that does not improve in 1 week.  You have new symptoms.  You are generally not feeling well. SEEK   IMMEDIATE MEDICAL CARE IF:   You have pain that radiates from your back into your legs.  You develop new bowel or bladder control problems.  You have unusual weakness or numbness in your arms or legs.  You develop nausea or vomiting.  You develop abdominal pain.  You feel faint. Document Released: 12/03/2005 Document Revised: 06/03/2012 Document Reviewed: 04/06/2014 [employer] Patient Information 2015 [employer]. This information is not intended to replace advice given to you by your health care provider. Make sure you  discuss any questions you have with your health care provider.  

## 2015-10-31 ENCOUNTER — Encounter (HOSPITAL_COMMUNITY): Payer: Self-pay | Admitting: Emergency Medicine

## 2015-10-31 ENCOUNTER — Emergency Department (HOSPITAL_COMMUNITY)
Admission: EM | Admit: 2015-10-31 | Discharge: 2015-10-31 | Disposition: A | Discharge status: Home / Self Care | Payer: Self-pay | Attending: Emergency Medicine | Admitting: Emergency Medicine

## 2015-10-31 DIAGNOSIS — Z880 Allergy status to penicillin: Secondary | ICD-10-CM | POA: Insufficient documentation

## 2015-10-31 DIAGNOSIS — Y998 Other external cause status: Secondary | ICD-10-CM | POA: Insufficient documentation

## 2015-10-31 DIAGNOSIS — Z79899 Other long term (current) drug therapy: Secondary | ICD-10-CM | POA: Insufficient documentation

## 2015-10-31 DIAGNOSIS — Y9389 Activity, other specified: Secondary | ICD-10-CM | POA: Insufficient documentation

## 2015-10-31 DIAGNOSIS — F17210 Nicotine dependence, cigarettes, uncomplicated: Secondary | ICD-10-CM | POA: Insufficient documentation

## 2015-10-31 DIAGNOSIS — S39012A Strain of muscle, fascia and tendon of lower back, initial encounter: Secondary | ICD-10-CM | POA: Insufficient documentation

## 2015-10-31 DIAGNOSIS — Y9289 Other specified places as the place of occurrence of the external cause: Secondary | ICD-10-CM | POA: Insufficient documentation

## 2015-10-31 DIAGNOSIS — X58XXXA Exposure to other specified factors, initial encounter: Secondary | ICD-10-CM | POA: Insufficient documentation

## 2015-10-31 NOTE — ED Notes (Addendum)
Patient states that on Wednesday he "twisted his back" while trying to catch his wife while she was falling. Patient states that pain is the worst when he "sits still too long or moves a certain way."

## 2015-10-31 NOTE — Discharge Instructions (Signed)
Low Back Sprain With Rehab A sprain is an injury in which a ligament is torn. The ligaments of the lower back are vulnerable to sprains. However, they are strong and require great force to be injured. These ligaments are important for stabilizing the spinal column. Sprains are classified into three categories. Grade 1 sprains cause pain, but the tendon is not lengthened. Grade 2 sprains include a lengthened ligament, due to the ligament being stretched or partially ruptured. With grade 2 sprains there is still function, although the function may be decreased. Grade 3 sprains involve a complete tear of the tendon or muscle, and function is usually impaired. SYMPTOMS   Severe pain in the lower back.  Sometimes, a feeling of a "pop," "snap," or tear, at the time of injury.  Tenderness and sometimes swelling at the injury site.  Uncommonly, bruising (contusion) within 48 hours of injury.  Muscle spasms in the back. CAUSES  Low back sprains occur when a force is placed on the ligaments that is greater than they can handle. Common causes of injury include:  Performing a stressful act while off-balance.  Repetitive stressful activities that involve movement of the lower back.  Direct hit (trauma) to the lower back. RISK INCREASES WITH:  Contact sports (football, wrestling).  Collisions (major skiing accidents).  Sports that require throwing or lifting (baseball, weightlifting).  Sports involving twisting of the spine (gymnastics, diving, tennis, golf).  Poor strength and flexibility.  Inadequate protection.  Previous back injury or surgery (especially fusion). PREVENTION  Wear properly fitted and padded protective equipment.  Warm up and stretch properly before activity.  Allow for adequate recovery between workouts.  Maintain physical fitness:  Strength, flexibility, and endurance.  Cardiovascular fitness.  Maintain a healthy body weight. PROGNOSIS  If treated properly,  low back sprains usually heal with non-surgical treatment. The length of time for healing depends on the severity of the injury.  RELATED COMPLICATIONS   Recurring symptoms, resulting in a chronic problem.  Chronic inflammation and pain in the low back.  Delayed healing or resolution of symptoms, especially if activity is resumed too soon.  Prolonged impairment.  Unstable or arthritic joints of the low back. TREATMENT  Treatment first involves the use of ice and medicine, to reduce pain and inflammation. The use of strengthening and stretching exercises may help reduce pain with activity. These exercises may be performed at home or with a therapist. Severe injuries may require referral to a therapist for further evaluation and treatment, such as ultrasound. Your caregiver may advise that you wear a back brace or corset, to help reduce pain and discomfort. Often, prolonged bed rest results in greater harm then benefit. Corticosteroid injections may be recommended. However, these should be reserved for the most serious cases. It is important to avoid using your back when lifting objects. At night, sleep on your back on a firm mattress, with a pillow placed under your knees. If non-surgical treatment is unsuccessful, surgery may be needed.  MEDICATION   If pain medicine is needed, nonsteroidal anti-inflammatory medicines (aspirin and ibuprofen), or other minor pain relievers (acetaminophen), are often advised.  Do not take pain medicine for 7 days before surgery.  Prescription pain relievers may be given, if your caregiver thinks they are needed. Use only as directed and only as much as you need.  Ointments applied to the skin may be helpful.  Corticosteroid injections may be given by your caregiver. These injections should be reserved for the most serious cases, because   they may only be given a certain number of times. HEAT AND COLD  Cold treatment (icing) should be applied for 10 to 15  minutes every 2 to 3 hours for inflammation and pain, and immediately after activity that aggravates your symptoms. Use ice packs or an ice massage.  Heat treatment may be used before performing stretching and strengthening activities prescribed by your caregiver, physical therapist, or athletic trainer. Use a heat pack or a warm water soak. SEEK MEDICAL CARE IF:   Symptoms get worse or do not improve in 2 to 4 weeks, despite treatment.  You develop numbness or weakness in either leg.  You lose bowel or bladder function.  Any of the following occur after surgery: fever, increased pain, swelling, redness, drainage of fluids, or bleeding in the affected area.  New, unexplained symptoms develop. (Drugs used in treatment may produce side effects.) EXERCISES  RANGE OF MOTION (ROM) AND STRETCHING EXERCISES - Low Back Sprain Most people with lower back pain will find that their symptoms get worse with excessive bending forward (flexion) or arching at the lower back (extension). The exercises that will help resolve your symptoms will focus on the opposite motion.  Your physician, physical therapist or athletic trainer will help you determine which exercises will be most helpful to resolve your lower back pain. Do not complete any exercises without first consulting with your caregiver. Discontinue any exercises which make your symptoms worse, until you speak to your caregiver. If you have pain, numbness or tingling which travels down into your buttocks, leg or foot, the goal of the therapy is for these symptoms to move closer to your back and eventually resolve. Sometimes, these leg symptoms will get better, but your lower back pain may worsen. This is often an indication of progress in your rehabilitation. Be very alert to any changes in your symptoms and the activities in which you participated in the 24 hours prior to the change. Sharing this information with your caregiver will allow him or her to most  efficiently treat your condition. These exercises may help you when beginning to rehabilitate your injury. Your symptoms may resolve with or without further involvement from your physician, physical therapist or athletic trainer. While completing these exercises, remember:   Restoring tissue flexibility helps normal motion to return to the joints. This allows healthier, less painful movement and activity.  An effective stretch should be held for at least 30 seconds.  A stretch should never be painful. You should only feel a gentle lengthening or release in the stretched tissue. FLEXION RANGE OF MOTION AND STRETCHING EXERCISES: STRETCH - Flexion, Single Knee to Chest   Lie on a firm bed or floor with both legs extended in front of you.  Keeping one leg in contact with the floor, bring your opposite knee to your chest. Hold your leg in place by either grabbing behind your thigh or at your knee.  Pull until you feel a gentle stretch in your low back. Hold __________ seconds.  Slowly release your grasp and repeat the exercise with the opposite side. Repeat __________ times. Complete this exercise __________ times per day.  STRETCH - Flexion, Double Knee to Chest  Lie on a firm bed or floor with both legs extended in front of you.  Keeping one leg in contact with the floor, bring your opposite knee to your chest.  Tense your stomach muscles to support your back and then lift your other knee to your chest. Hold your legs in   place by either grabbing behind your thighs or at your knees.  Pull both knees toward your chest until you feel a gentle stretch in your low back. Hold __________ seconds.  Tense your stomach muscles and slowly return one leg at a time to the floor. Repeat __________ times. Complete this exercise __________ times per day.  STRETCH - Low Trunk Rotation  Lie on a firm bed or floor. Keeping your legs in front of you, bend your knees so they are both pointed toward the  ceiling and your feet are flat on the floor.  Extend your arms out to the side. This will stabilize your upper body by keeping your shoulders in contact with the floor.  Gently and slowly drop both knees together to one side until you feel a gentle stretch in your low back. Hold for __________ seconds.  Tense your stomach muscles to support your lower back as you bring your knees back to the starting position. Repeat the exercise to the other side. Repeat __________ times. Complete this exercise __________ times per day  EXTENSION RANGE OF MOTION AND FLEXIBILITY EXERCISES: STRETCH - Extension, Prone on Elbows   Lie on your stomach on the floor, a bed will be too soft. Place your palms about shoulder width apart and at the height of your head.  Place your elbows under your shoulders. If this is too painful, stack pillows under your chest.  Allow your body to relax so that your hips drop lower and make contact more completely with the floor.  Hold this position for __________ seconds.  Slowly return to lying flat on the floor. Repeat __________ times. Complete this exercise __________ times per day.  RANGE OF MOTION - Extension, Prone Press Ups  Lie on your stomach on the floor, a bed will be too soft. Place your palms about shoulder width apart and at the height of your head.  Keeping your back as relaxed as possible, slowly straighten your elbows while keeping your hips on the floor. You may adjust the placement of your hands to maximize your comfort. As you gain motion, your hands will come more underneath your shoulders.  Hold this position __________ seconds.  Slowly return to lying flat on the floor. Repeat __________ times. Complete this exercise __________ times per day.  RANGE OF MOTION- Quadruped, Neutral Spine   Assume a hands and knees position on a firm surface. Keep your hands under your shoulders and your knees under your hips. You may place padding under your knees for  comfort.  Drop your head and point your tailbone toward the ground below you. This will round out your lower back like an angry cat. Hold this position for __________ seconds.  Slowly lift your head and release your tail bone so that your back sags into a large arch, like an old horse.  Hold this position for __________ seconds.  Repeat this until you feel limber in your low back.  Now, find your "sweet spot." This will be the most comfortable position somewhere between the two previous positions. This is your neutral spine. Once you have found this position, tense your stomach muscles to support your low back.  Hold this position for __________ seconds. Repeat __________ times. Complete this exercise __________ times per day.  STRENGTHENING EXERCISES - Low Back Sprain These exercises may help you when beginning to rehabilitate your injury. These exercises should be done near your "sweet spot." This is the neutral, low-back arch, somewhere between fully rounded and   fully arched, that is your least painful position. When performed in this safe range of motion, these exercises can be used for people who have either a flexion or extension based injury. These exercises may resolve your symptoms with or without further involvement from your physician, physical therapist or athletic trainer. While completing these exercises, remember:   Muscles can gain both the endurance and the strength needed for everyday activities through controlled exercises.  Complete these exercises as instructed by your physician, physical therapist or athletic trainer. Increase the resistance and repetitions only as guided.  You may experience muscle soreness or fatigue, but the pain or discomfort you are trying to eliminate should never worsen during these exercises. If this pain does worsen, stop and make certain you are following the directions exactly. If the pain is still present after adjustments, discontinue the  exercise until you can discuss the trouble with your caregiver. STRENGTHENING - Deep Abdominals, Pelvic Tilt   Lie on a firm bed or floor. Keeping your legs in front of you, bend your knees so they are both pointed toward the ceiling and your feet are flat on the floor.  Tense your lower abdominal muscles to press your low back into the floor. This motion will rotate your pelvis so that your tail bone is scooping upwards rather than pointing at your feet or into the floor. With a gentle tension and even breathing, hold this position for __________ seconds. Repeat __________ times. Complete this exercise __________ times per day.  STRENGTHENING - Abdominals, Crunches   Lie on a firm bed or floor. Keeping your legs in front of you, bend your knees so they are both pointed toward the ceiling and your feet are flat on the floor. Cross your arms over your chest.  Slightly tip your chin down without bending your neck.  Tense your abdominals and slowly lift your trunk high enough to just clear your shoulder blades. Lifting higher can put excessive stress on the lower back and does not further strengthen your abdominal muscles.  Control your return to the starting position. Repeat __________ times. Complete this exercise __________ times per day.  STRENGTHENING - Quadruped, Opposite UE/LE Lift   Assume a hands and knees position on a firm surface. Keep your hands under your shoulders and your knees under your hips. You may place padding under your knees for comfort.  Find your neutral spine and gently tense your abdominal muscles so that you can maintain this position. Your shoulders and hips should form a rectangle that is parallel with the floor and is not twisted.  Keeping your trunk steady, lift your right hand no higher than your shoulder and then your left leg no higher than your hip. Make sure you are not holding your breath. Hold this position for __________ seconds.  Continuing to keep  your abdominal muscles tense and your back steady, slowly return to your starting position. Repeat with the opposite arm and leg. Repeat __________ times. Complete this exercise __________ times per day.  STRENGTHENING - Abdominals and Quadriceps, Straight Leg Raise   Lie on a firm bed or floor with both legs extended in front of you.  Keeping one leg in contact with the floor, bend the other knee so that your foot can rest flat on the floor.  Find your neutral spine, and tense your abdominal muscles to maintain your spinal position throughout the exercise.  Slowly lift your straight leg off the floor about 6 inches for a count of   15, making sure to not hold your breath.  Still keeping your neutral spine, slowly lower your leg all the way to the floor. Repeat this exercise with each leg __________ times. Complete this exercise __________ times per day. POSTURE AND BODY MECHANICS CONSIDERATIONS - Low Back Sprain Keeping correct posture when sitting, standing or completing your activities will reduce the stress put on different body tissues, allowing injured tissues a chance to heal and limiting painful experiences. The following are general guidelines for improved posture. Your physician or physical therapist will provide you with any instructions specific to your needs. While reading these guidelines, remember:  The exercises prescribed by your provider will help you have the flexibility and strength to maintain correct postures.  The correct posture provides the best environment for your joints to work. All of your joints have less wear and tear when properly supported by a spine with good posture. This means you will experience a healthier, less painful body.  Correct posture must be practiced with all of your activities, especially prolonged sitting and standing. Correct posture is as important when doing repetitive low-stress activities (typing) as it is when doing a single heavy-load  activity (lifting). RESTING POSITIONS Consider which positions are most painful for you when choosing a resting position. If you have pain with flexion-based activities (sitting, bending, stooping, squatting), choose a position that allows you to rest in a less flexed posture. You would want to avoid curling into a fetal position on your side. If your pain worsens with extension-based activities (prolonged standing, working overhead), avoid resting in an extended position such as sleeping on your stomach. Most people will find more comfort when they rest with their spine in a more neutral position, neither too rounded nor too arched. Lying on a non-sagging bed on your side with a pillow between your knees, or on your back with a pillow under your knees will often provide some relief. Keep in mind, being in any one position for a prolonged period of time, no matter how correct your posture, can still lead to stiffness. PROPER SITTING POSTURE In order to minimize stress and discomfort on your spine, you must sit with correct posture. Sitting with good posture should be effortless for a healthy body. Returning to good posture is a gradual process. Many people can work toward this most comfortably by using various supports until they have the flexibility and strength to maintain this posture on their own. When sitting with proper posture, your ears will fall over your shoulders and your shoulders will fall over your hips. You should use the back of the chair to support your upper back. Your lower back will be in a neutral position, just slightly arched. You may place a small pillow or folded towel at the base of your lower back for  support.  When working at a desk, create an environment that supports good, upright posture. Without extra support, muscles tire, which leads to excessive strain on joints and other tissues. Keep these recommendations in mind: CHAIR:  A chair should be able to slide under your desk  when your back makes contact with the back of the chair. This allows you to work closely.  The chair's height should allow your eyes to be level with the upper part of your monitor and your hands to be slightly lower than your elbows. BODY POSITION  Your feet should make contact with the floor. If this is not possible, use a foot rest.  Keep your ears   over your shoulders. This will reduce stress on your neck and low back. INCORRECT SITTING POSTURES  If you are feeling tired and unable to assume a healthy sitting posture, do not slouch or slump. This puts excessive strain on your back tissues, causing more damage and pain. Healthier options include:  Using more support, like a lumbar pillow.  Switching tasks to something that requires you to be upright or walking.  Talking a brief walk.  Lying down to rest in a neutral-spine position. PROLONGED STANDING WHILE SLIGHTLY LEANING FORWARD  When completing a task that requires you to lean forward while standing in one place for a long time, place either foot up on a stationary 2-4 inch high object to help maintain the best posture. When both feet are on the ground, the lower back tends to lose its slight inward curve. If this curve flattens (or becomes too large), then the back and your other joints will experience too much stress, tire more quickly, and can cause pain. CORRECT STANDING POSTURES Proper standing posture should be assumed with all daily activities, even if they only take a few moments, like when brushing your teeth. As in sitting, your ears should fall over your shoulders and your shoulders should fall over your hips. You should keep a slight tension in your abdominal muscles to brace your spine. Your tailbone should point down to the ground, not behind your body, resulting in an over-extended swayback posture.  INCORRECT STANDING POSTURES  Common incorrect standing postures include a forward head, locked knees and/or an excessive  swayback. WALKING Walk with an upright posture. Your ears, shoulders and hips should all line-up. PROLONGED ACTIVITY IN A FLEXED POSITION When completing a task that requires you to bend forward at your waist or lean over a low surface, try to find a way to stabilize 3 out of 4 of your limbs. You can place a hand or elbow on your thigh or rest a knee on the surface you are reaching across. This will provide you more stability, so that your muscles do not tire as quickly. By keeping your knees relaxed, or slightly bent, you will also reduce stress across your lower back. CORRECT LIFTING TECHNIQUES DO :  Assume a wide stance. This will provide you more stability and the opportunity to get as close as possible to the object which you are lifting.  Tense your abdominals to brace your spine. Bend at the knees and hips. Keeping your back locked in a neutral-spine position, lift using your leg muscles. Lift with your legs, keeping your back straight.  Test the weight of unknown objects before attempting to lift them.  Try to keep your elbows locked down at your sides in order get the best strength from your shoulders when carrying an object.  Always ask for help when lifting heavy or awkward objects. INCORRECT LIFTING TECHNIQUES DO NOT:   Lock your knees when lifting, even if it is a small object.  Bend and twist. Pivot at your feet or move your feet when needing to change directions.  Assume that you can safely pick up even a paperclip without proper posture.   This information is not intended to replace advice given to you by your health care provider. Make sure you discuss any questions you have with your health care provider.   Document Released: 12/03/2005 Document Revised: 12/24/2014 Document Reviewed: 03/17/2009 [employer] Patient Education 2016 [employer]  

## 2015-10-31 NOTE — ED Notes (Signed)
Dr. Denton LankSteinl at bedside.Pt reported lower back but pain has gotten better since arrival.

## 2015-10-31 NOTE — ED Provider Notes (Signed)
CSN: [id number]     Arrival date & time 10/31/15  1191 History   First MD Initiated Contact with Patient 10/31/15 [phone]     Chief Complaint  Patient presents with  . Back Pain     (Consider location/radiation/quality/duration/timing/severity/associated sxs/prior Treatment) Patient is a 59 y.o. male presenting with back pain. The history is provided by the patient.  Back Pain Location:  Lumbar spine Quality:  Aching Radiates to:  Does not radiate Pain severity:  Mild Pain is:  Unable to specify Onset quality:  Sudden Duration:  5 days Timing:  Constant Progression:  Improving Chronicity:  New Context: twisting   Relieved by:  Ibuprofen Worsened by:  Movement Ineffective treatments:  None tried Associated symptoms: no bladder incontinence, no bowel incontinence, no fever, no numbness, no paresthesias and no weakness    Patient is a 59 year old male with history of tobacco abuse who presents with improving low back pain. Patient reports that he injured his back 5 days ago after catching his wife who was falling down the stairs. Patient reports that he knew that he "twisted" his back in the process. He has been taking ibuprofen which has helped with the pain. Pain is described as alchemy and does not radiate. He now has only very little pain left. Patient presented to the ED today because he needed a work excuse note. Patient denies any fevers or chills. No bowel or bladder incontinence. No weakness or numbness.   History reviewed. No pertinent past medical history. Past Surgical History  Procedure Laterality Date  . Fracture surgery  2008    L foot fx   No family history on file. Social History  Substance Use Topics  . Smoking status: Current Every Day Smoker -- 1.00 packs/day    Types: Cigarettes  . Smokeless tobacco: None  . Alcohol Use: No    Review of Systems  Constitutional: Negative for fever.  HENT: Negative.   Eyes: Negative.   Respiratory: Negative.    Cardiovascular: Negative.   Gastrointestinal: Negative.  Negative for bowel incontinence.  Endocrine: Negative.   Genitourinary: Negative.  Negative for bladder incontinence.  Musculoskeletal: Positive for back pain.  Neurological: Negative for weakness, numbness and paresthesias.  Hematological: Negative.   Psychiatric/Behavioral: Negative.       Allergies  Penicillins  Home Medications   Prior to Admission medications   Medication Sig Start Date End Date Taking? Authorizing Provider  ALPRAZolam Prudy Feeler) 0.25 MG tablet Take 0.5 mg by mouth at bedtime as needed for anxiety or sleep.    Historical Provider, MD  clindamycin (CLEOCIN) 300 MG capsule Take 1 capsule (300 mg total) by mouth 4 (four) times daily. X 7 days Patient not taking: Reported on 04/02/2015 03/08/15   Nada Boozer Hess, PA-C  diazepam (VALIUM) 5 MG tablet Take 1 tablet (5 mg total) by mouth 2 (two) times daily. 03/14/15   Junius Finner, PA-C  diphenhydramine-acetaminophen (TYLENOL PM) 25-500 MG TABS Take 1 tablet by mouth at bedtime as needed (pain/sleep).    Historical Provider, MD  HYDROcodone-acetaminophen (NORCO/VICODIN) 5-325 MG per tablet Take 1-2 tablets by mouth every 4 (four) hours as needed. 03/14/15   Junius Finner, PA-C  ibuprofen (ADVIL,MOTRIN) 600 MG tablet Take 1 tablet (600 mg total) by mouth every 6 (six) hours as needed. Patient taking differently: Take 600 mg by mouth every 6 (six) hours as needed for moderate pain.  03/14/15   Junius Finner, PA-C  Multiple Vitamin (MULTIVITAMIN WITH MINERALS) TABS tablet Take 1  tablet by mouth daily.    Historical Provider, MD  ondansetron (ZOFRAN ODT) 4 MG disintegrating tablet Take 1 tablet (4 mg total) by mouth every 8 (eight) hours as needed for nausea or vomiting. Patient not taking: Reported on 03/14/2015 08/07/14   Renne CriglerJoshua Geiple, PA-C  orphenadrine (NORFLEX) 100 MG tablet Take 1 tablet (100 mg total) by mouth 2 (two) times daily. 04/02/15   Mirian MoMatthew Gentry, MD  traMADol  (ULTRAM) 50 MG tablet Take 1 tablet (50 mg total) by mouth every 6 (six) hours as needed for moderate pain. 04/02/15   Mirian MoMatthew Gentry, MD   BP 151/81 mmHg  Pulse 87  Temp(Src) 97.6 F (36.4 C) (Oral)  Resp 18  Ht 6\' 1"  (1.854 m)  Wt 149 lb (67.586 kg)  BMI 19.66 kg/m2  SpO2 100% Physical Exam  Constitutional: He is oriented to person, place, and time. He appears well-developed and well-nourished. No distress.  HENT:  Head: Normocephalic and atraumatic.  Eyes: EOM are normal. Pupils are equal, round, and reactive to light.  Neck: Neck supple.  Cardiovascular: Normal rate, regular rhythm and normal heart sounds.   No murmur heard. Pulmonary/Chest: Effort normal and breath sounds normal. No respiratory distress. He has no wheezes.  Abdominal: Soft. Bowel sounds are normal. He exhibits no distension. There is no tenderness.  Musculoskeletal: Normal range of motion.       Lumbar back: He exhibits tenderness (over lumbar paraspinal muscles).  Neurological: He is alert and oriented to person, place, and time.  Skin: Skin is dry.  Psychiatric: He has a normal mood and affect. His behavior is normal.  Nursing note and vitals reviewed.   ED Course  Procedures (including critical care time) Labs Review Labs Reviewed - No data to display  Imaging Review No results found. I have personally reviewed and evaluated these images and lab results as part of my medical decision-making.   EKG Interpretation None      MDM   Final diagnoses:  Low back strain, initial encounter   Patient is a 59 year old man presenting with 5 days of improving low back pain secondary to a strain he suffered while catching his wife who was falling down the stairs. No red flag signs or symptoms. Physical exam normal. No indication for imaging. Symptoms improving with ibuprofen. Will discharge home to continue conservative management.    Ardith Darkaleb M Parker, MD 10/31/15 [phone]  Cathren LaineKevin Steinl, MD 10/31/15 [phone]

## 2016-02-08 ENCOUNTER — Encounter (HOSPITAL_COMMUNITY): Payer: Self-pay | Admitting: Emergency Medicine

## 2016-02-08 ENCOUNTER — Emergency Department (HOSPITAL_COMMUNITY)
Admission: EM | Admit: 2016-02-08 | Discharge: 2016-02-08 | Disposition: A | Discharge status: Home / Self Care | Payer: Self-pay | Attending: Emergency Medicine | Admitting: Emergency Medicine

## 2016-02-08 ENCOUNTER — Emergency Department (HOSPITAL_COMMUNITY): Payer: Self-pay

## 2016-02-08 DIAGNOSIS — S93402A Sprain of unspecified ligament of left ankle, initial encounter: Secondary | ICD-10-CM

## 2016-02-08 DIAGNOSIS — R030 Elevated blood-pressure reading, without diagnosis of hypertension: Secondary | ICD-10-CM | POA: Insufficient documentation

## 2016-02-08 DIAGNOSIS — X501XXA Overexertion from prolonged static or awkward postures, initial encounter: Secondary | ICD-10-CM | POA: Insufficient documentation

## 2016-02-08 DIAGNOSIS — F17210 Nicotine dependence, cigarettes, uncomplicated: Secondary | ICD-10-CM | POA: Insufficient documentation

## 2016-02-08 DIAGNOSIS — Y9301 Activity, walking, marching and hiking: Secondary | ICD-10-CM | POA: Insufficient documentation

## 2016-02-08 DIAGNOSIS — Z880 Allergy status to penicillin: Secondary | ICD-10-CM | POA: Insufficient documentation

## 2016-02-08 DIAGNOSIS — G8929 Other chronic pain: Secondary | ICD-10-CM

## 2016-02-08 DIAGNOSIS — Z8781 Personal history of (healed) traumatic fracture: Secondary | ICD-10-CM | POA: Insufficient documentation

## 2016-02-08 DIAGNOSIS — M25572 Pain in left ankle and joints of left foot: Secondary | ICD-10-CM

## 2016-02-08 DIAGNOSIS — IMO0001 Reserved for inherently not codable concepts without codable children: Secondary | ICD-10-CM

## 2016-02-08 DIAGNOSIS — Y998 Other external cause status: Secondary | ICD-10-CM | POA: Insufficient documentation

## 2016-02-08 DIAGNOSIS — Y9289 Other specified places as the place of occurrence of the external cause: Secondary | ICD-10-CM | POA: Insufficient documentation

## 2016-02-08 MED ORDER — IBUPROFEN 600 MG PO TABS: 600.0000 mg | ORAL_TABLET | Freq: Four times a day (QID) | ORAL | Status: DC | PRN

## 2016-02-08 NOTE — ED Provider Notes (Signed)
CSN: [id number]     Arrival date & time 02/08/16  4098 History   First MD Initiated Contact with Patient 02/08/16 1025     Chief Complaint  Patient presents with  . Foot Pain     (Consider location/radiation/quality/duration/timing/severity/associated sxs/prior Treatment) Patient is a 60 y.o. male presenting with lower extremity pain. The history is provided by the patient and medical records. No language interpreter was used.  Foot Pain Associated symptoms include arthralgias and myalgias. Pertinent negatives include no abdominal pain, nausea, numbness or vomiting.   ADAIAH MORKEN is a 60 y.o. male  with a PMH of prior left foot fracture surgically repaired in 2008 who presents to the Emergency Department complaining of acute on chronic left foot pain which began two days ago after he rolled ankle while walking. Admits to swelling initially which has now resolved. Denies fever, echhymosis, deformity. Able to bear weight with pain/limp. Tylenol with little relief. Followed by [facility].   History reviewed. No pertinent past medical history. Past Surgical History  Procedure Laterality Date  . Fracture surgery  2008    L foot fx   No family history on file. Social History  Substance Use Topics  . Smoking status: Current Every Day Smoker -- 1.00 packs/day    Types: Cigarettes  . Smokeless tobacco: None  . Alcohol Use: No    Review of Systems  Gastrointestinal: Negative for nausea, vomiting and abdominal pain.  Musculoskeletal: Positive for myalgias and arthralgias.  Skin: Negative for color change.  Neurological: Negative for numbness.      Allergies  Penicillins  Home Medications   Prior to Admission medications   Medication Sig Start Date End Date Taking? Authorizing Provider  diphenhydramine-acetaminophen (TYLENOL PM) 25-500 MG TABS Take 1 tablet by mouth at bedtime as needed (pain/sleep).   Yes Historical Provider, MD  ibuprofen (ADVIL,MOTRIN) 600 MG  tablet Take 1 tablet (600 mg total) by mouth every 6 (six) hours as needed. 02/08/16   Mickle Campton Pilcher Yolanda Dockendorf, PA-C   BP 163/82 mmHg  Pulse 76  Temp(Src) 98.6 F (37 C) (Oral)  Resp 16  Ht 6' (1.829 m)  Wt 67.586 kg  BMI 20.20 kg/m2  SpO2 100% Physical Exam  Constitutional: He is oriented to person, place, and time. He appears well-developed and well-nourished.  Alert and in no acute distress  HENT:  Head: Normocephalic and atraumatic.  Cardiovascular: Normal rate, regular rhythm, normal heart sounds and intact distal pulses.  Exam reveals no gallop and no friction rub.   No murmur heard. Pulmonary/Chest: Effort normal and breath sounds normal. No respiratory distress. He has no wheezes. He has no rales.  Abdominal: Soft. He exhibits no distension. There is no tenderness.  Musculoskeletal:       Feet:  Foot/Ankle: No gross deformity noted. Patient has full active and passive range of motion. There is no joint effusion noted. No erythema, or warmth overlaying the joint. There is tenderness as depicted in image. No pain to fifth metatarsal area, no pain to navicular region. 2+ DP pulses, sensation intact to medial, lateral, dorsal and plantar aspects.  Neurological: He is alert and oriented to person, place, and time.  Nursing note and vitals reviewed.   ED Course  Procedures (including critical care time) Labs Review Labs Reviewed - No data to display  Imaging Review Dg Foot Complete Left  02/08/2016  CLINICAL DATA:  60 year old male with new onset left foot and heel pain for 2 days with no recent injury. Pain  with weight-bearing. Remote injury 2006. Initial encounter. EXAM: LEFT FOOT - COMPLETE 3+ VIEW COMPARISON:  Left foot radiographs and CT 09/04/2005 FINDINGS: Sequelae of comminuted left calcaneus fracture and ORIF. Hardware appears intact. There is probably subtalar arthrodesis. Mild plantar surface calcaneus degenerative spurring. Other tarsal bones appear intact. Questionable  cuboid-cuneiform ankylosis since 2006. Metatarsals and phalanges intact. Distal tibia and fibula appear intact. No ankle joint effusion is evident. 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 No acute osseous abnormality identified. Electronically Signed   By: Odessa Fleming M.D.   On: 02/08/2016 07:27   I have personally reviewed and evaluated these images and lab results as part of my medical decision-making.   EKG Interpretation None      MDM   Final diagnoses:  Ankle sprain, left, initial encounter  Chronic ankle pain, left  Elevated blood pressure   A&P: Ankle sprain: no bony tenderness on exam - x-rays reviewed with chronic changes, no acute abnormalities. Ace wrap given, symptomatic care instructions discussed. Informed patient to follow up with his orthopedist for symptoms persisting longer than one week. Rx for ibuprofen.   Elevated blood pressure: Follow up in one week for BP check with PCP.   [facility] Macenzie Burford, PA-C 02/08/16 1151  Nelva Nay, MD 02/09/16 1435

## 2016-02-08 NOTE — ED Notes (Signed)
Bed: WA26 Expected date:  Expected time:  Means of arrival:  Comments: 

## 2016-02-08 NOTE — ED Notes (Signed)
Pt c/o L foot pain, pt states he has old injuries with hardware. No new injury

## 2016-02-08 NOTE — Discharge Instructions (Signed)
Be sure to read and understand instructions below prior to leaving the hospital. If your symptoms persist without any improvement in 1 week it is recommended that you follow up with your orthopedist. Take anti-inflammatory as directed.   Ankle Sprain  An ankle sprain is an injury to the ligaments that hold the ankle joint together. Your X-ray today showed no evidence of fracture, however keep all follow-up appointments with an orthopedic specialist to have follow-up X-rays, because fractures may not appear until 3 days after the acute injury.    TREATMENT  Rest, ice, elevation, and compression are the basic modes of treatment.    HOME CARE INSTRUCTIONS  Apply ice to the sore area for 15 to 20 minutes, 3 to 4 times per day. Do this while you are awake for the first 2 days. This can be stopped when the swelling goes away. Put the ice in a plastic bag and place a towel between the bag of ice and your skin.  Keep your leg elevated when possible to lessen swelling.  If your caregiver recommends crutches, use them as instructed for 1 week. Then, you may walk on your ankle weight bearing as tolerated.  You may take off your ankle stabilizer at night and to take a shower or bath. Wiggle your toes in the splint several times per day if you are able.  Do not drive a vehicle on pain medication. ACTIVITY:            - Weight bearing as tolerated            - Exercises should be limited to pain free range of motion            - Can start mobilization by tracing the alphabet with your foot in the air.       SEEK MEDICAL CARE IF:  You have an increase in bruising, swelling, or pain.  Your toes feel cold.  Pain relief is not achieved with medications.  EMERGENCY:: Your toes are numb or blue or you have severe pain.   MAKE SURE YOU:  Understand these instructions.  Will watch your condition.  Will get help right away if you are not doing well or get worse   COLD THERAPY DIRECTIONS:  Ice or gel packs  can be used to reduce both pain and swelling. Ice is the most helpful within the first 24 to 48 hours after an injury or flareup from overusing a muscle or joint.  Ice is effective, has very few side effects, and is safe for most people to use.   If you expose your skin to cold temperatures for too long or without the proper protection, you can damage your skin or nerves. Watch for signs of skin damage due to cold.   HOME CARE INSTRUCTIONS  Follow these tips to use ice and cold packs safely.  Place a dry or damp towel between the ice and skin. A damp towel will cool the skin more quickly, so you may need to shorten the time that the ice is used.  For a more rapid response, add gentle compression to the ice.  Ice for no more than 10 to 20 minutes at a time. The bonier the area you are icing, the less time it will take to get the benefits of ice.  Check your skin after 5 minutes to make sure there are no signs of a poor response to cold or skin damage.  Rest 20 minutes or more in  between uses.  Once your skin is numb, you can end your treatment. You can test numbness by very lightly touching your skin. The touch should be so light that you do not see the skin dimple from the pressure of your fingertip. When using ice, most people will feel these normal sensations in this order: cold, burning, aching, and numbness.

## 2016-02-08 NOTE — Progress Notes (Addendum)
Pt twisted his left foot walking up the stairs on Feb 20th. No deformity noted. Painful to bear weight. Pain is a 81/0. Positive pedal pulse. Pt stated he has had a foot x-ray. Ace wrap applied by ortho tech. Pt refused an ice pack./ PA aware of elevated BP(11:10am )

## 2018-11-05 ENCOUNTER — Encounter (HOSPITAL_COMMUNITY): Payer: Self-pay

## 2018-11-05 ENCOUNTER — Emergency Department (HOSPITAL_COMMUNITY)
Admission: EM | Admit: 2018-11-05 | Discharge: 2018-11-05 | Disposition: A | Discharge status: Home / Self Care | Payer: Self-pay | Attending: Emergency Medicine | Admitting: Emergency Medicine

## 2018-11-05 DIAGNOSIS — M5442 Lumbago with sciatica, left side: Secondary | ICD-10-CM | POA: Insufficient documentation

## 2018-11-05 DIAGNOSIS — F17210 Nicotine dependence, cigarettes, uncomplicated: Secondary | ICD-10-CM | POA: Insufficient documentation

## 2018-11-05 DIAGNOSIS — M5441 Lumbago with sciatica, right side: Secondary | ICD-10-CM | POA: Insufficient documentation

## 2018-11-05 MED ORDER — METHOCARBAMOL 500 MG PO TABS
1000.0000 mg | ORAL_TABLET | Freq: Once | ORAL | Status: AC
  Administered 2018-11-05: 1000 mg via ORAL
  Filled 2018-11-05: qty 2

## 2018-11-05 MED ORDER — METHOCARBAMOL 500 MG PO TABS: 500.0000 mg | ORAL_TABLET | Freq: Three times a day (TID) | ORAL | 0 refills | Status: DC | PRN

## 2018-11-05 MED ORDER — KETOROLAC TROMETHAMINE 30 MG/ML IJ SOLN
30.0000 mg | Freq: Once | INTRAMUSCULAR | Status: AC
Start: 2018-11-05 — End: 2018-11-05
  Administered 2018-11-05: 30 mg via INTRAMUSCULAR
  Filled 2018-11-05: qty 1

## 2018-11-05 NOTE — Discharge Instructions (Signed)
You are leaving the emergency department AGAINST MEDICAL ADVICE.  It was our recommendation that you receive MRI imaging of your back given the new numbness that you are experiencing.  Please return to the emergency department at any time for continued care.

## 2018-11-05 NOTE — ED Triage Notes (Addendum)
Patient arrived POV. Patient thinks he pulled a muscle in his lower back. Patient was moving 7 sheets of steel several weeks ago. Patient can no long tolerate the pain.   10/10 throbbing/sharp pain.

## 2018-11-05 NOTE — ED Provider Notes (Signed)
[facility] Department Provider Note MRN:  [id number]  Arrival date & time: 11/05/18     Chief Complaint   Back Pain   History of Present Illness   Cameron HaymakerWilliam R [name] is a 62 y.o. year-old male with a history of chronic back pain presenting to the ED with chief complaint of back pain.  Patient was lifting heavy metal objects at work 3 weeks ago.  Experienced sudden onset severe lower back pain, right-sided.  Accompanied by immediate incontinence of stool.  Patient has tried to recover at home without medical assistance.  Pain has not improved, only worsening.  Radiates down the legs bilaterally.  Denies any continued bowel incontinence, denies urinary retention.  Having a very difficult time walking for the past 3 to 4 days, states mostly due to pain, denies weakness.  Today noticed numbness of the bellybutton region and proximal legs.  Denies fevers.  Review of Systems  A complete 10 system review of systems was obtained and all systems are negative except as noted in the HPI and PMH.   Patient's Health History   History reviewed. No pertinent past medical history.  Past Surgical History:  Procedure Laterality Date  . FRACTURE SURGERY  2008   L foot fx    History reviewed. No pertinent family history.  Social History   Socioeconomic History  . Marital status: Single    Spouse name: Not on file  . Number of children: Not on file  . Years of education: Not on file  . Highest education level: Not on file  Occupational History  . Not on file  Social Needs  . Financial resource strain: Not on file  . Food insecurity:    Worry: Not on file    Inability: Not on file  . Transportation needs:    Medical: Not on file    Non-medical: Not on file  Tobacco Use  . Smoking status: Current Every Day Smoker    Packs/day: 1.00    Types: Cigarettes  Substance and Sexual Activity  . Alcohol use: No  . Drug use: No  . Sexual activity: Not on file  Lifestyle  .  Physical activity:    Days per week: Not on file    Minutes per session: Not on file  . Stress: Not on file  Relationships  . Social connections:    Talks on phone: Not on file    Gets together: Not on file    Attends religious service: Not on file    Active member of club or organization: Not on file    Attends meetings of clubs or organizations: Not on file    Relationship status: Not on file  . Intimate partner violence:    Fear of current or ex partner: Not on file    Emotionally abused: Not on file    Physically abused: Not on file    Forced sexual activity: Not on file  Other Topics Concern  . Not on file  Social History Narrative  . Not on file     Physical Exam  Vital Signs and Nursing Notes reviewed Vitals:   11/05/18 1132 11/05/18 1200  BP: 125/90 (!) 142/101  Pulse: 87 81  Resp: 16 16  Temp:    SpO2: 97% 94%    CONSTITUTIONAL: Chronically ill-appearing, NAD NEURO:  Alert and oriented x 3, no focal deficits EYES:  eyes equal and reactive ENT/NECK:  no LAD, no JVD CARDIO: Regular rate, well-perfused, normal S1 and  S2 PULM:  CTAB no wheezing or rhonchi GI/GU:  normal bowel sounds, non-distended, non-tender MSK/SPINE:  No gross deformities, no edema, bilateral positive straight leg test, lack of sensation of the mid abdomen periumbilical region, lack of sensation of bilateral thighs SKIN:  no rash, atraumatic PSYCH:  Appropriate speech and behavior  Diagnostic and Interventional Summary    Labs Reviewed - No data to display  No orders to display    Medications  ketorolac (TORADOL) 30 MG/ML injection 30 mg (30 mg Intramuscular Given 11/05/18 1039)  methocarbamol (ROBAXIN) tablet 1,000 mg (1,000 mg Oral Given 11/05/18 1039)     Procedures Critical Care  ED Course and Medical Decision Making  I have reviewed the triage vital signs and the nursing notes.  Pertinent labs & imaging results that were available during my care of the patient were reviewed by  me and considered in my medical decision making (see below for details).  Concern for possible myelopathy in this 62 year old male with recent acute back pain related to lifting, progressive decline in function, today on exam with fairly evident sensory deficit.  MRI imaging indicated at this time.  However, patient is very concerned about the financial implications, states that he cannot miss work, currently refusing diagnostic testing.  Will attempt pain control and attempt a second conversation to convince him to get this testing.  Explained to patient that an undiagnosed myelopathy will only get worse and cause permanent dysfunction.  Feeling better after pain medications, but still refusing MRI.  Leaving the emergency department AGAINST MEDICAL ADVICE.  Elmer Sow. Pilar Plate, MD [facility] Emergency Medicine [facility] [email or web address]  Final Clinical Impressions(s) / ED Diagnoses     ICD-10-CM   1. Acute bilateral low back pain with bilateral sciatica M54.42    M54.41     ED Discharge Orders         Ordered    methocarbamol (ROBAXIN) 500 MG tablet  Every 8 hours PRN     11/05/18 1218             Sabas Sous, MD 11/05/18 1221

## 2018-12-06 ENCOUNTER — Encounter (HOSPITAL_COMMUNITY): Payer: Self-pay | Admitting: *Deleted

## 2018-12-06 ENCOUNTER — Ambulatory Visit (HOSPITAL_COMMUNITY): Payer: Self-pay

## 2018-12-06 ENCOUNTER — Emergency Department (HOSPITAL_COMMUNITY): Payer: Self-pay

## 2018-12-06 ENCOUNTER — Emergency Department (HOSPITAL_COMMUNITY)
Admission: EM | Admit: 2018-12-06 | Discharge: 2018-12-06 | Disposition: A | Discharge status: Home / Self Care | Payer: Self-pay | Attending: Emergency Medicine | Admitting: Emergency Medicine

## 2018-12-06 DIAGNOSIS — Z79899 Other long term (current) drug therapy: Secondary | ICD-10-CM | POA: Insufficient documentation

## 2018-12-06 DIAGNOSIS — F17210 Nicotine dependence, cigarettes, uncomplicated: Secondary | ICD-10-CM | POA: Insufficient documentation

## 2018-12-06 DIAGNOSIS — M5416 Radiculopathy, lumbar region: Secondary | ICD-10-CM | POA: Insufficient documentation

## 2018-12-06 LAB — I-STAT CHEM 8, ED
BUN: 15 mg/dL (ref 8–23)
Calcium, Ion: 1.14 mmol/L — ABNORMAL LOW (ref 1.15–1.40)
Chloride: 102 mmol/L (ref 98–111)
Creatinine, Ser: 0.8 mg/dL (ref 0.61–1.24)
GLUCOSE: 85 mg/dL (ref 70–99)
HCT: 40 % (ref 39.0–52.0)
Hemoglobin: 13.6 g/dL (ref 13.0–17.0)
Potassium: 3.2 mmol/L — ABNORMAL LOW (ref 3.5–5.1)
SODIUM: 138 mmol/L (ref 135–145)
TCO2: 27 mmol/L (ref 22–32)

## 2018-12-06 MED ORDER — DEXAMETHASONE SODIUM PHOSPHATE 10 MG/ML IJ SOLN
10.0000 mg | Freq: Once | INTRAMUSCULAR | Status: AC
  Administered 2018-12-06: 10 mg via INTRAMUSCULAR
  Filled 2018-12-06: qty 1

## 2018-12-06 MED ORDER — PREDNISONE 10 MG PO TABS: 50.0000 mg | ORAL_TABLET | Freq: Every day | ORAL | 0 refills | Status: DC

## 2018-12-06 MED ORDER — NAPROXEN 375 MG PO TABS: 375.0000 mg | ORAL_TABLET | Freq: Two times a day (BID) | ORAL | 0 refills | Status: DC

## 2018-12-06 MED ORDER — NAPROXEN 375 MG PO TABS
375.0000 mg | ORAL_TABLET | Freq: Once | ORAL | Status: AC
  Administered 2018-12-06: 375 mg via ORAL
  Filled 2018-12-06: qty 1

## 2018-12-06 NOTE — Discharge Instructions (Addendum)
Please follow-up with the outpatient resources we provided to see if they can coordinate further care.  Return to the ER immediately if you start having urinary incontinence, urinary retention, bowel incontinence, pins and needle sensation by your genitalia, severe weakness in your legs.

## 2018-12-06 NOTE — Care Management Note (Signed)
Case Management Note  Patient Details  Name: Cameron [name] MRN: [id number] Date of Birth: 02/09/1956  Subjective/Objective:    PCP                Action/Plan: 12/06/2018 1030 am Spoke to pt and provided him with [facility]  Expected Discharge Date:                  Expected Discharge Plan:  Home/Self Care  In-House Referral:  NA  Discharge planning Services  NA  Post Acute Care Choice:  NA Choice offered to:  NA  DME Arranged:  N/A DME Agency:  NA  HH Arranged:  NA HH Agency:  NA  Status of Service:  Completed, signed off  If discussed at Long Length of Stay Meetings, dates discussed:    Additional Comments:  Cameron [name], Cameron Marcy Ellen, RN 12/06/2018, 4:58 PM

## 2018-12-06 NOTE — ED Triage Notes (Signed)
Pt complains of left leg numbness x 1 week. Pt states he has had back pain x 2 months. Pt was seen in ED and told he needed MRI but states he could not afford it.

## 2018-12-06 NOTE — ED Provider Notes (Addendum)
[facility]-EMERGENCY DEPT Provider Note   CSN: [id number] Arrival date & time: 12/06/18  0805     History   Chief Complaint Chief Complaint  Patient presents with  . Numbness    left leg    HPI Cameron [name] is a 62 y.o. male.  HPI 62 year old male comes in with chief complaint of numbness and weakness in his left leg. Patient reports that he has had back pain and numbness and tingling in his left leg for this last several months.  Over the past few days he has had progression of his symptoms.  He gets numbness more frequently and often his legs are giving out and he falls down.  He gets no warning before his legs are giving out.  He had come to the ER few days ago with these complaints.  At that time he had mentioned bilateral lower extremity weakness and an MRI was ordered, however patient left AMA because he did not want to pay heavy bills.  He has not been able to get outpatient follow-up and therefore returns to the ER because his symptoms are progressing.  There is no associated numbness or tingling in the perineal region, urinary incontinence, urinary retention or bowel incontinence.  It appears according to the HPI during last visit that patient was having incontinence, on repeated questioning patient denies any acute incontinence with urine, urinary retention or bowel incontinence.  History reviewed. No pertinent past medical history.  There are no active problems to display for this patient.   Past Surgical History:  Procedure Laterality Date  . FRACTURE SURGERY  2008   L foot fx        Home Medications    Prior to Admission medications   Medication Sig Start Date End Date Taking? Authorizing Provider  diphenhydramine-acetaminophen (TYLENOL PM) 25-500 MG TABS Take 1 tablet by mouth at bedtime as needed (pain/sleep).    [provider]  ibuprofen (ADVIL,MOTRIN) 600 MG tablet Take 1 tablet (600 mg total) by mouth every 6 (six) hours  as needed. 02/08/16   Ward, Chase Picket, PA-C  methocarbamol (ROBAXIN) 500 MG tablet Take 1 tablet (500 mg total) by mouth every 8 (eight) hours as needed for muscle spasms. 11/05/18   Sabas Sous, MD  naproxen (NAPROSYN) 375 MG tablet Take 1 tablet (375 mg total) by mouth 2 (two) times daily. 12/06/18   Derwood Kaplan, MD  predniSONE (DELTASONE) 10 MG tablet Take 5 tablets (50 mg total) by mouth daily. 12/06/18   Derwood Kaplan, MD    Family History No family history on file.  Social History Social History   Tobacco Use  . Smoking status: Current Every Day Smoker    Packs/day: 1.00    Types: Cigarettes  Substance Use Topics  . Alcohol use: No  . Drug use: No     Allergies   Penicillins   Review of Systems Review of Systems  Constitutional: Positive for activity change.  Genitourinary: Negative for difficulty urinating.  Musculoskeletal: Positive for back pain and gait problem.  Neurological: Positive for weakness and numbness.     Physical Exam Updated Vital Signs BP 137/88 (BP Location: Right Arm)   Pulse (!) 106   Temp 98 F (36.7 C) (Oral)   Resp 16   SpO2 100%   Physical Exam Vitals signs and nursing note reviewed.  Constitutional:      Appearance: He is well-developed.  HENT:     Head: Atraumatic.  Neck:  Musculoskeletal: Neck supple.  Cardiovascular:     Rate and Rhythm: Normal rate.  Pulmonary:     Effort: Pulmonary effort is normal.  Skin:    General: Skin is warm.  Neurological:     Mental Status: He is alert and oriented to person, place, and time.     Comments: Patient has diminished sensation over the left lower extremity compared to the contralateral side.  He also has diminished patellar reflex bilaterally. Patient able to ambulate      ED Treatments / Results  Labs (all labs ordered are listed, but only abnormal results are displayed) Labs Reviewed  I-STAT CHEM 8, ED - Abnormal; Notable for the following components:       Result Value   Potassium 3.2 (*)    Calcium, Ion 1.14 (*)    All other components within normal limits    EKG None  Radiology Dg Lumbar Spine Complete  Result Date: 12/06/2018 CLINICAL DATA:  Left leg numbness 1 week.  Back pain 2 months. EXAM: LUMBAR SPINE - COMPLETE 4+ VIEW COMPARISON:  04/02/2015 FINDINGS: Six non rib-bearing lumbar vertebrae. Vertebral body alignment and heights are normal. Mild spondylosis throughout the lumbar spine to include facet arthropathy over the mid to lower lumbar spine. Minimal multilevel disc space narrowing unchanged. No evidence of compression fracture or spondylolisthesis. Calcified plaque over the abdominal aorta and iliac vessels. IMPRESSION: No acute findings. Mild spondylosis of the lumbar spine with minimal multilevel disc disease unchanged. Electronically Signed   By: Elberta Fortisaniel  Boyle M.D.   On: 12/06/2018 09:17    Procedures Procedures (including critical care time)  Medications Ordered in ED Medications  dexamethasone (DECADRON) injection 10 mg (10 mg Intramuscular Given 12/06/18 1053)  naproxen (NAPROSYN) tablet 375 mg (375 mg Oral Given 12/06/18 1052)     Initial Impression / Assessment and Plan / ED Course  I have reviewed the triage vital signs and the nursing notes.  Pertinent labs & imaging results that were available during my care of the patient were reviewed by me and considered in my medical decision making (see chart for details).  Clinical Course as of Dec 06 1217  Sat Dec 06, 2018  1021 Repeat social worker more than an hour ago.  They have still not returned the page.  At this time I will proceed with MRI since patient is having progressive radiculopathy.   [AN]    Clinical Course User Index [AN] Derwood KaplanNanavati, Herlinda Heady, MD    Patient comes in with chief complaint of back pain with progressively worsening left-sided radiculopathy. It seems that he has had chronic DJD of the spine with progression of the symptoms recently.   History is not suggestive of cauda equina or cord compression.  Patient unfortunately does not have outpatient follow-up, and he has attempted to get outpatient MRI by himself -but was denied  X-ray of the lumbar spine ordered and reviewed. Although it does not appear that patient has cord compression or cauda equina, he is having progressively worsening radiculopathy that is affecting his ADLs and ability to work.  We will call social work to see if patient can [facility], so that he can get appropriate work-up done as an outpatient.  If we do not hear from social worker or we are not able to get patient appointment then we will get an MRI while he is here.  12:18 PM Social work team saw the patient and gave him appropriate information outpatient resources.  He has ambulated. I do not think he needs an emergent MRI, and since social worker was able to provide some resources we will discharge him.  Patient also has to be home to his wife because of external reasons.  He will return to the ER if he is unable to establish a follow-up appointment and his symptoms are getting worse.  At that time we might want to consider getting elective MRI for him emergently given the social circumstances.  Final Clinical Impressions(s) / ED Diagnoses   Final diagnoses:  Lumbar radiculopathy    ED Discharge Orders         Ordered    naproxen (NAPROSYN) 375 MG tablet  2 times daily     12/06/18 1148    predniSONE (DELTASONE) 10 MG tablet  Daily     12/06/18 1148           Derwood KaplanNanavati, Quinton Voth, MD 12/06/18 1026    Derwood KaplanNanavati, Abiha Lukehart, MD 12/06/18 1220

## 2019-04-03 ENCOUNTER — Emergency Department (HOSPITAL_COMMUNITY): Payer: Self-pay

## 2019-04-03 ENCOUNTER — Emergency Department (HOSPITAL_COMMUNITY)
Admission: EM | Admit: 2019-04-03 | Discharge: 2019-04-03 | Disposition: A | Discharge status: Home / Self Care | Payer: Self-pay | Attending: Emergency Medicine | Admitting: Emergency Medicine

## 2019-04-03 ENCOUNTER — Other Ambulatory Visit: Payer: Self-pay

## 2019-04-03 ENCOUNTER — Encounter (HOSPITAL_COMMUNITY): Payer: Self-pay | Admitting: Emergency Medicine

## 2019-04-03 DIAGNOSIS — F17210 Nicotine dependence, cigarettes, uncomplicated: Secondary | ICD-10-CM | POA: Insufficient documentation

## 2019-04-03 DIAGNOSIS — J3489 Other specified disorders of nose and nasal sinuses: Secondary | ICD-10-CM | POA: Insufficient documentation

## 2019-04-03 DIAGNOSIS — Z79899 Other long term (current) drug therapy: Secondary | ICD-10-CM | POA: Insufficient documentation

## 2019-04-03 DIAGNOSIS — R05 Cough: Secondary | ICD-10-CM | POA: Insufficient documentation

## 2019-04-03 DIAGNOSIS — R030 Elevated blood-pressure reading, without diagnosis of hypertension: Secondary | ICD-10-CM | POA: Insufficient documentation

## 2019-04-03 DIAGNOSIS — R059 Cough, unspecified: Secondary | ICD-10-CM

## 2019-04-03 MED ORDER — CETIRIZINE HCL 10 MG PO TABS: 10.0000 mg | ORAL_TABLET | Freq: Every day | ORAL | 0 refills | Status: DC

## 2019-04-03 MED ORDER — FLUTICASONE PROPIONATE 50 MCG/ACT NA SUSP: 1.0000 | Freq: Every day | NASAL | 2 refills | Status: DC

## 2019-04-03 NOTE — ED Notes (Signed)
X-ray at bedside

## 2019-04-03 NOTE — ED Triage Notes (Signed)
Pt reports for 2 weeks. Had runny nose, congestion, cough getting up phlegm and SOB. Reports that his boss made him come get checked out. Denies fevers.

## 2019-04-03 NOTE — ED Notes (Signed)
Provider at bedside

## 2019-04-03 NOTE — ED Notes (Signed)
Pt reports hx of high blood pressure, informs this nurse he does not take medication

## 2019-04-03 NOTE — ED Notes (Signed)
Pt d/c home per MD order. Discharge summary reviewed, pt verbalizes understanding. RX and work note provided. Pt voicing no complaints at discharge. Ambulatory off unit.

## 2019-04-03 NOTE — ED Notes (Signed)
Case management contacted for consult per secretary.

## 2019-04-03 NOTE — ED Notes (Signed)
Pt ambulatory to bathroom with no assistance

## 2019-04-03 NOTE — Discharge Instructions (Signed)
Please read and follow all provided instructions.  Your diagnoses today include:  1. Cough   2. Rhinorrhea   3. Elevated blood pressure reading     Tests performed today include: TESTS Vital signs. See below for your results today.   Medications prescribed:   Take any prescribed medications only as directed.  Please start taking Zyrtec once daily at night. Please use Flonase, one spray in each nostril twice a day.  Home care instructions:  Follow any educational materials contained in this packet.  Follow-up instructions: Please follow-up with your primary care provider in the next 3 days.  Return instructions:  Please return to the Emergency Department if you experience worsening symptoms. Please return with worsening wheezing, shortness of breath, or difficulty breathing. Return with persistent fever above 100.69F.  Please return if you have any other emergent concerns.  Additional Information:  Your vital signs today were: BP (!) 171/105    Pulse 63    Temp 97.6 F (36.4 C) (Oral)    Resp 18    SpO2 100%  If your blood pressure (BP) was elevated above 135/85 this visit, please have this repeated by your doctor within one month. --------------

## 2019-04-03 NOTE — Care Management (Signed)
TOC CM spoke to pt and will arrange appt at [facility] on Monday. Pt agreeable to follow at clinic. Isidoro Donning RN CCM Case Mgmt phone [phone]

## 2019-04-03 NOTE — ED Provider Notes (Signed)
[facility]-EMERGENCY DEPT Provider Note   CSN: [id number] Arrival date & time: 04/03/19  1422    History   Chief Complaint Chief Complaint  Patient presents with  . Cough  . Nasal Congestion    HPI Cameron [name] is a 63 y.o. male.     HPI   Patient is a 63 year old male with no significant past medical history presenting for cough and need for medical clearance.  [place]  Patient reports that he had to complete a self screening evaluation for work and reported that he has been coughing for the past 3 weeks.  He also reports rhinorrhea.  He was instructed to come to the emergency department for medical clearance.  Patient reports that his cough is nonproductive.  Denies hemoptysis.  Denies any significant shortness of breath or any chest pain with exertion.  Patient denies any fevers over the past 3 weeks.  Patient denies any congestion, sore throat, abdominal pain, nausea, or vomiting.  He denies knowing anyone has tested positive for COVID-19. Patient is a current 0.75 pack per day smoker. Does have a prior heavier smoking history for many years as well.   History reviewed. No pertinent past medical history.  There are no active problems to display for this patient.   Past Surgical History:  Procedure Laterality Date  . FRACTURE SURGERY  2008   L foot fx        Home Medications    Prior to Admission medications   Medication Sig Start Date End Date Taking? Authorizing Provider  diphenhydramine-acetaminophen (TYLENOL PM) 25-500 MG TABS Take 1 tablet by mouth at bedtime as needed (pain/sleep).    [provider]  ibuprofen (ADVIL,MOTRIN) 600 MG tablet Take 1 tablet (600 mg total) by mouth every 6 (six) hours as needed. 02/08/16   Ward, Chase Picket, PA-C  methocarbamol (ROBAXIN) 500 MG tablet Take 1 tablet (500 mg total) by mouth every 8 (eight) hours as needed for muscle spasms. 11/05/18    Sabas Sous, MD  naproxen (NAPROSYN) 375 MG tablet Take 1 tablet (375 mg total) by mouth 2 (two) times daily. 12/06/18   Derwood Kaplan, MD  predniSONE (DELTASONE) 10 MG tablet Take 5 tablets (50 mg total) by mouth daily. 12/06/18   Derwood Kaplan, MD    Family History No family history on file.  Social History Social History   Tobacco Use  . Smoking status: Current Every Day Smoker    Packs/day: 1.00    Types: Cigarettes  Substance Use Topics  . Alcohol use: No  . Drug use: No     Allergies   Penicillins   Review of Systems Review of Systems  Constitutional: Negative for chills and fever.  HENT: Positive for rhinorrhea. Negative for congestion, sore throat, trouble swallowing and voice change.   Respiratory: Positive for cough. Negative for shortness of breath.   Cardiovascular: Negative for chest pain.  Gastrointestinal: Negative for abdominal pain, nausea and vomiting.  Musculoskeletal: Negative for arthralgias.  Skin: Negative for rash.  Neurological: Negative for headaches.  All other systems reviewed and are negative.    Physical Exam Updated Vital Signs BP (!) 171/105   Pulse 63   Temp 97.6 F (36.4 C) (Oral)   Resp 18   SpO2 100%   Physical Exam Vitals signs and nursing note reviewed.  Constitutional:      General: He is not in acute distress.    Appearance: He  is well-developed. He is not ill-appearing or diaphoretic.  HENT:     Head: Normocephalic and atraumatic.     Mouth/Throat:     Mouth: Mucous membranes are moist.     Comments: No erythema of posterior pharynx. Eyes:     Conjunctiva/sclera: Conjunctivae normal.     Pupils: Pupils are equal, round, and reactive to light.  Neck:     Musculoskeletal: Normal range of motion and neck supple.  Cardiovascular:     Rate and Rhythm: Normal rate and regular rhythm.     Heart sounds: S1 normal and S2 normal. No murmur.  Pulmonary:     Effort: Pulmonary effort is normal.     Breath sounds:  Normal breath sounds. No wheezing or rales.     Comments: Normal rest normal respiratory effort.  Patient converses comfortably in full sentences. Abdominal:     General: There is no distension.  Musculoskeletal: Normal range of motion.        General: No deformity.  Lymphadenopathy:     Cervical: No cervical adenopathy.  Skin:    General: Skin is warm and dry.     Findings: No erythema or rash.  Neurological:     Mental Status: He is alert.     Comments: Cranial nerves grossly intact. Patient moves extremities symmetrically and with good coordination.  Psychiatric:        Behavior: Behavior normal.        Thought Content: Thought content normal.        Judgment: Judgment normal.      ED Treatments / Results  Labs (all labs ordered are listed, but only abnormal results are displayed) Labs Reviewed - No data to display  EKG EKG Interpretation  Date/Time:  Friday April 03 2019 14:32:08 EDT Ventricular Rate:  102 PR Interval:    QRS Duration: 91 QT Interval:  349 QTC Calculation: 455 R Axis:   85 Text Interpretation:  Sinus tachycardia Borderline right axis deviation No old tracing to compare Confirmed by Pricilla Loveless [phone]) on 04/03/2019 2:59:15 PM   Radiology Dg Chest Portable 1 View  Result Date: 04/03/2019 CLINICAL DATA:  Productive cough, shortness of breath, and rhinorrhea. EXAM: PORTABLE CHEST 1 VIEW COMPARISON:  None. FINDINGS: The cardiomediastinal silhouette is within normal limits. The lungs are hyperinflated. No airspace consolidation, edema, pleural effusion, pneumothorax is identified. No acute osseous abnormality is seen. IMPRESSION: Hyperinflation without evidence of acute airspace disease. Electronically Signed   By: Sebastian Ache M.D.   On: 04/03/2019 16:20    Procedures Procedures (including critical care time)  Medications Ordered in ED Medications - No data to display   Initial Impression / Assessment and Plan / ED Course  I have reviewed the  triage vital signs and the nursing notes.  Pertinent labs & imaging results that were available during my care of the patient were reviewed by me and considered in my medical decision making (see chart for details).  Clinical Course as of Apr 02 1714  Fri Apr 03, 2019  1644 Spoke with Helmut Muster, RN CM who will work on setting up patient with an outpatient appointment for blood pressure.  Review chart, and patient has had elevated blood pressure readings in the past, but not as elevated as today.  Would like some consecutive readings before starting patient on antihypertensives.   [AM]    Clinical Course User Index [AM] Elisha Ponder, PA-C        Patient is nontoxic-appearing, afebrile, with normal respiratory  effort.  No tachypnea, tachycardia, no hypoxia.  Patient presenting for medical clearance after he failed a screening evaluation at work due to nonproductive cough for 3 weeks.  Patient does describe to me that he is newly working outside and feels that allergic rhinitis may be contributing to his symptoms.  Patient has a normal lung exam.  Chest x-ray, reviewed by me shows pulmonary hyperinflation but no evidence of infiltrate or airspace disease.  I feel that patient's overall presentation is very low risk for COVID-19.  Allergic rhinitis versus cough due to smoking history more likely.  Will have patient start Zyrtec and fluticasone nasal spray.  I did discuss with patient that I recommend wearing a mask at work, however I do not feel that any further quarantine the be beneficial at this time.  Case discussed with attending physician who is in agreement.  Return precautions given for any shortness of breath, productive cough, or fevers.  Patient is in understanding and agrees with the plan of care.  Incidentally, patient did have elevated blood pressure today.  He has had elevated readings in the past, but usually not in the 170 systolic.  I discussed with case management who will set up  patient an outpatient appointment.  I appreciate her involvement.  Cameron [name] was evaluated in Emergency Department on 04/03/2019 for the symptoms described in the history of present illness. He was evaluated in the context of the global COVID-19 pandemic, which necessitated consideration that the patient might be at risk for infection with the SARS-CoV-2 virus that causes COVID-19. Institutional protocols and algorithms that pertain to the evaluation of patients at risk for COVID-19 are in a state of rapid change based on information released by regulatory bodies including the CDC and federal and state organizations. These policies and algorithms were followed during the patient's care in the ED.  Final Clinical Impressions(s) / ED Diagnoses   Final diagnoses:  Cough  Rhinorrhea  Elevated blood pressure reading    ED Discharge Orders         Ordered    cetirizine (ZYRTEC) 10 MG tablet  Daily     04/03/19 1726    fluticasone (FLONASE) 50 MCG/ACT nasal spray  Daily     04/03/19 1726           Elisha PonderMurray, Shameer Molstad B, PA-C 04/04/19 0103    Pricilla LovelessGoldston, Scott, MD 04/06/19 [phone]

## 2019-04-06 ENCOUNTER — Telehealth: Payer: Self-pay | Admitting: *Deleted

## 2019-04-06 NOTE — Telephone Encounter (Signed)
TOC CM -referral to arrange follow up appointment with PCP  NCM contacted pt and gave information on appt arranged at [facility] on 4/29 at 1050 am. Pt has address on dc instructions. Explained importance of follow up with PCP. Pt verbalized understanding. Isidoro Donning RN CCM Case Mgmt phone [phone]

## 2019-04-14 NOTE — Progress Notes (Signed)
Patient ID: Cameron [name] Holstine, male   DOB: 11/23/1956, 63 y.o.   MRN: [id number]  Virtual Visit via Telephone Note  I connected with Cameron [name] Leclere on 04/15/19 at 10:50 AM EDT by telephone and verified that I am speaking with the correct person using two identifiers.   I discussed the limitations, risks, security and privacy concerns of performing an evaluation and management service by telephone and the availability of in person appointments. I also discussed with the patient that there may be a patient responsible charge related to this service. The patient expressed understanding and agreed to proceed.  Patient location:  car My Location:  [facility] office Persons on the call:  Myself and the patient   History of Present Illness: After being seen in the ED 04/03/2019.  Was seen in ED for dry cough.  BP was high and has been high the last several times it was recorded in Epic.  Cough has resolved.  Denies other health problems.  Denies CP/dizziness/HA.    From ED note: Patient is nontoxic-appearing, afebrile, with normal respiratory effort.  No tachypnea, tachycardia, no hypoxia.  Patient presenting for medical clearance after he failed a screening evaluation at work due to nonproductive cough for 3 weeks.  Patient does describe to me that he is newly working outside and feels that allergic rhinitis may be contributing to his symptoms.  Patient has a normal lung exam.  Chest x-ray, reviewed by me shows pulmonary hyperinflation but no evidence of infiltrate or airspace disease.  I feel that patient's overall presentation is very low risk for COVID-19.  Allergic rhinitis versus cough due to smoking history more likely.  Will have patient start Zyrtec and fluticasone nasal spray.  I did discuss with patient that I recommend wearing a mask at work, however I do not feel that any further quarantine the be beneficial at this time.  Case discussed with attending physician who is in agreement.  Return precautions  given for any shortness of breath, productive cough, or fevers.  Patient is in understanding and agrees with the plan of care.  Incidentally, patient did have elevated blood pressure today.  He has had elevated readings in the past, but usually not in the 170 systolic.  I discussed with case management who will set up patient an outpatient appointment.  I appreciate her involvement.  Cameron [name] Shearman was evaluated in Emergency Department on 04/03/2019 for the symptoms described in the history of present illness. He was evaluated in the context of the global COVID-19 pandemic, which necessitated consideration that the patient might be at risk for infection with the SARS-CoV-2 virus that causes COVID-19. Institutional protocols and algorithms that pertain to the evaluation of patients at risk for COVID-19 are in a state of rapid change based on information released by regulatory bodies including the CDC and federal and state organizations. These policies and algorithms were followed during the patient's care in the ED.   Observations/Objective:  A&Ox3.  NAD.  TP linear.  Speech is clear.     Assessment and Plan: 1. Hypertension, unspecified type Take 1/2 tab daily for the first week then increase to 1 tablet daily is no SE.  Check BP OOO 3 times per week if able and record and bring to next visit(I steered away from HCTZ due to low K+ in 12/19 and away from ACE bc he recently had a dry cough and do not want to confuse cough/side effect.) - amLODipine (NORVASC) 10 MG tablet; Take 1  tablet (10 mg total) by mouth daily.  Dispense: 30 tablet; Refill: 3  2. Encounter for examination following treatment at hospital Much improved  3. Dry cough Resolved.      Follow Up Instructions:    I discussed the assessment and treatment plan with the patient. The patient was provided an opportunity to ask questions and all were answered. The patient agreed with the plan and demonstrated an understanding of the  instructions.   The patient was advised to call back or seek an in-person evaluation if the symptoms worsen or if the condition fails to improve as anticipated.  I provided 11 minutes of non-face-to-face time during this encounter.   Georgian Co, PA-C

## 2019-04-15 ENCOUNTER — Ambulatory Visit: Payer: Self-pay | Attending: Family Medicine | Admitting: Physician Assistant

## 2019-04-15 ENCOUNTER — Other Ambulatory Visit: Payer: Self-pay

## 2019-04-15 DIAGNOSIS — I10 Essential (primary) hypertension: Secondary | ICD-10-CM

## 2019-04-15 DIAGNOSIS — R058 Other specified cough: Secondary | ICD-10-CM

## 2019-04-15 DIAGNOSIS — R05 Cough: Secondary | ICD-10-CM

## 2019-04-15 DIAGNOSIS — Z09 Encounter for follow-up examination after completed treatment for conditions other than malignant neoplasm: Secondary | ICD-10-CM

## 2019-04-15 MED ORDER — AMLODIPINE BESYLATE 10 MG PO TABS: 10.0000 mg | ORAL_TABLET | Freq: Every day | ORAL | 3 refills | Status: DC

## 2019-04-15 NOTE — Progress Notes (Signed)
Concerns with blood pressure - elevated in the ED Was told that he had irregular heart beat.

## 2019-05-27 ENCOUNTER — Other Ambulatory Visit: Payer: Self-pay

## 2019-05-27 ENCOUNTER — Encounter (HOSPITAL_BASED_OUTPATIENT_CLINIC_OR_DEPARTMENT_OTHER): Payer: Self-pay | Admitting: Emergency Medicine

## 2019-05-27 ENCOUNTER — Emergency Department (HOSPITAL_BASED_OUTPATIENT_CLINIC_OR_DEPARTMENT_OTHER): Payer: Self-pay

## 2019-05-27 ENCOUNTER — Emergency Department (HOSPITAL_BASED_OUTPATIENT_CLINIC_OR_DEPARTMENT_OTHER)
Admission: EM | Admit: 2019-05-27 | Discharge: 2019-05-27 | Disposition: A | Discharge status: Home / Self Care | Payer: Self-pay | Attending: Emergency Medicine | Admitting: Emergency Medicine

## 2019-05-27 DIAGNOSIS — F17210 Nicotine dependence, cigarettes, uncomplicated: Secondary | ICD-10-CM | POA: Insufficient documentation

## 2019-05-27 DIAGNOSIS — I10 Essential (primary) hypertension: Secondary | ICD-10-CM | POA: Insufficient documentation

## 2019-05-27 DIAGNOSIS — Z79899 Other long term (current) drug therapy: Secondary | ICD-10-CM | POA: Insufficient documentation

## 2019-05-27 DIAGNOSIS — G43009 Migraine without aura, not intractable, without status migrainosus: Secondary | ICD-10-CM | POA: Insufficient documentation

## 2019-05-27 HISTORY — DX: Anxiety disorder, unspecified: F41.9

## 2019-05-27 HISTORY — DX: Dorsalgia, unspecified: M54.9

## 2019-05-27 HISTORY — DX: Migraine, unspecified, not intractable, without status migrainosus: G43.909

## 2019-05-27 HISTORY — DX: Other seasonal allergic rhinitis: J30.2

## 2019-05-27 HISTORY — DX: Unspecified hearing loss, unspecified ear: H91.90

## 2019-05-27 HISTORY — DX: Essential (primary) hypertension: I10

## 2019-05-27 MED ORDER — PROCHLORPERAZINE MALEATE 10 MG PO TABS
10.0000 mg | ORAL_TABLET | Freq: Once | ORAL | Status: AC
  Administered 2019-05-27: 10 mg via ORAL
  Filled 2019-05-27: qty 1

## 2019-05-27 MED ORDER — ACETAMINOPHEN 325 MG PO TABS
650.0000 mg | ORAL_TABLET | Freq: Once | ORAL | Status: AC
  Administered 2019-05-27: 650 mg via ORAL
  Filled 2019-05-27: qty 2

## 2019-05-27 MED ORDER — DIPHENHYDRAMINE HCL 25 MG PO CAPS
50.0000 mg | ORAL_CAPSULE | Freq: Once | ORAL | Status: AC
  Administered 2019-05-27: 50 mg via ORAL
  Filled 2019-05-27: qty 2

## 2019-05-27 MED ORDER — DEXAMETHASONE 6 MG PO TABS
10.0000 mg | ORAL_TABLET | Freq: Once | ORAL | Status: AC
  Administered 2019-05-27: 10 mg via ORAL
  Filled 2019-05-27: qty 1

## 2019-05-27 NOTE — ED Notes (Signed)
ED Provider at bedside discussing test results and dispo plan of care. 

## 2019-05-27 NOTE — Discharge Instructions (Signed)
Follow up with primary care doctor. Go see an eye doctor for prescription glasses.

## 2019-05-27 NOTE — ED Provider Notes (Signed)
[facility] EMERGENCY DEPARTMENT Provider Note   CSN: [id number] Arrival date & time: 05/27/19  [id number]    History   Chief Complaint Chief Complaint  Patient presents with  . Headache    HPI Cameron [name] is a 63 y.o. male.     The history is provided by the patient.  Migraine  This is a recurrent (once a year bad migraines, similar today, with bilateral visual disturbance) problem. The current episode started 1 to 2 hours ago. The problem occurs constantly. The problem has been gradually improving. Associated symptoms include headaches. Pertinent negatives include no chest pain, no abdominal pain and no shortness of breath. Nothing aggravates the symptoms. Nothing relieves the symptoms. He has tried nothing for the symptoms. The treatment provided no relief.    Past Medical History:  Diagnosis Date  . Anxiety   . Back pain   . Hard of hearing   . Hypertension   . Migraine   . Seasonal allergies     Patient Active Problem List   Diagnosis Date Noted  . Hypertension 04/15/2019    Past Surgical History:  Procedure Laterality Date  . FACIAL FRACTURE SURGERY    . FOOT FRACTURE SURGERY    . FRACTURE SURGERY  2008   L foot fx  . KNEE SURGERY          Home Medications    Prior to Admission medications   Medication Sig Start Date End Date Taking? Authorizing Provider  ALPRAZolam Prudy Feeler(XANAX) 1 MG tablet Take 1 mg by mouth at bedtime as needed for anxiety.    [provider]  amLODipine (NORVASC) 10 MG tablet Take 1 tablet (10 mg total) by mouth daily. 04/15/19   Anders SimmondsMcClung, Angela M, PA-C  cetirizine (ZYRTEC) 10 MG tablet Take 1 tablet (10 mg total) by mouth daily for 30 days. 04/03/19 05/03/19  Aviva KluverMurray, Alyssa B, PA-C  diphenhydramine-acetaminophen (TYLENOL PM) 25-500 MG TABS Take 1 tablet by mouth at bedtime as needed (pain/sleep).    [provider]  fluticasone (FLONASE) 50 MCG/ACT nasal spray Place 1 spray into both nostrils daily. 04/03/19    Aviva KluverMurray, Alyssa B, PA-C  ibuprofen (ADVIL,MOTRIN) 600 MG tablet Take 1 tablet (600 mg total) by mouth every 6 (six) hours as needed. Patient not taking: Reported on 04/15/2019 02/08/16   Ward, Chase PicketJaime Pilcher, PA-C    Family History No family history on file.  Social History Social History   Tobacco Use  . Smoking status: Current Every Day Smoker    Packs/day: 0.50    Types: Cigarettes  . Smokeless tobacco: Never Used  Substance Use Topics  . Alcohol use: No  . Drug use: No     Allergies   Penicillins   Review of Systems Review of Systems  Constitutional: Negative for chills and fever.  HENT: Negative for ear pain and sore throat.   Eyes: Positive for visual disturbance. Negative for photophobia, pain, discharge, redness and itching.  Respiratory: Negative for cough and shortness of breath.   Cardiovascular: Negative for chest pain and palpitations.  Gastrointestinal: Negative for abdominal pain and vomiting.  Genitourinary: Negative for dysuria and hematuria.  Musculoskeletal: Negative for arthralgias and back pain.  Skin: Negative for color change and rash.  Neurological: Positive for headaches. Negative for dizziness, tremors, seizures, syncope, facial asymmetry, speech difficulty, weakness, light-headedness and numbness.  All other systems reviewed and are negative.    Physical Exam Updated Vital Signs  ED Triage Vitals  Enc Vitals Group  BP 05/27/19 0732 (!) 155/114     Pulse Rate 05/27/19 0732 74     Resp 05/27/19 0732 16     Temp 05/27/19 0732 97.8 F (36.6 C)     Temp Source 05/27/19 0732 Oral     SpO2 05/27/19 0732 100 %     Weight 05/27/19 0733 150 lb (68 kg)     Height 05/27/19 0733 6' (1.829 m)     Head Circumference --      Peak Flow --      Pain Score 05/27/19 0733 8     Pain Loc --      Pain Edu? --      Excl. in GC? --     Physical Exam Vitals signs and nursing note reviewed.  Constitutional:      General: He is not in acute distress.     Appearance: He is well-developed. He is not ill-appearing.  HENT:     Head: Normocephalic and atraumatic.  Eyes:     General: No visual field deficit.    Extraocular Movements: Extraocular movements intact.     Right eye: Normal extraocular motion and no nystagmus.     Left eye: Normal extraocular motion and no nystagmus.     Conjunctiva/sclera: Conjunctivae normal.     Pupils: Pupils are equal, round, and reactive to light.     Right eye: Pupil is reactive.     Left eye: Pupil is reactive.     Comments: 20/50 vision bilaterally with readers on, no visual field deficit cut off  Neck:     Musculoskeletal: Normal range of motion and neck supple.  Cardiovascular:     Rate and Rhythm: Normal rate and regular rhythm.     Heart sounds: Normal heart sounds. No murmur.  Pulmonary:     Effort: Pulmonary effort is normal. No respiratory distress.     Breath sounds: Normal breath sounds.  Abdominal:     Palpations: Abdomen is soft.     Tenderness: There is no abdominal tenderness.  Skin:    General: Skin is warm and dry.  Neurological:     Mental Status: He is alert and oriented to person, place, and time.     Cranial Nerves: No cranial nerve deficit, dysarthria or facial asymmetry.     Sensory: No sensory deficit.     Motor: No weakness.     Gait: Gait normal.     Comments: 5+ out of 5 strength throughout, normal sensation, no drift, normal finger-nose-finger, normal speech  Psychiatric:        Mood and Affect: Mood normal.      ED Treatments / Results  Labs (all labs ordered are listed, but only abnormal results are displayed) Labs Reviewed - No data to display  EKG None  Radiology Ct Head Wo Contrast  Result Date: 05/27/2019 CLINICAL DATA:  Headache and blurred vision EXAM: CT HEAD WITHOUT CONTRAST TECHNIQUE: Contiguous axial images were obtained from the base of the skull through the vertex without intravenous contrast. COMPARISON:  Head CT August 25, 2005 FINDINGS:  Brain: Ventricles and sulci appear within normal limits for age. Prominence of the cisterna magna is felt to represent an anatomic variant. There is no intracranial mass, hemorrhage, extra-axial fluid collection, or midline shift. There is minimal small vessel disease adjacent to the frontal horns of the lateral ventricles. Elsewhere brain parenchyma appears unremarkable. No evident acute infarct. Vascular: There is no appreciable hyperdense vessel. There is calcification in each carotid siphon  region. Skull: Bony calvarium appears intact. Sinuses/Orbits: Visualized paranasal sinuses are clear. Visualized orbits appear symmetric bilaterally. Other: Mastoid air cells are clear. IMPRESSION: Slight periventricular small vessel disease. No acute infarct. No mass or hemorrhage. There are foci of arterial vascular calcification. Electronically Signed   By: Lowella Grip III M.D.   On: 05/27/2019 08:22    Procedures Procedures (including critical care time)  Medications Ordered in ED Medications  prochlorperazine (COMPAZINE) tablet 10 mg (10 mg Oral Given 05/27/19 0803)  diphenhydrAMINE (BENADRYL) capsule 50 mg (50 mg Oral Given 05/27/19 0802)  dexamethasone (DECADRON) tablet 10 mg (10 mg Oral Given 05/27/19 0803)  acetaminophen (TYLENOL) tablet 650 mg (650 mg Oral Given 05/27/19 0802)     Initial Impression / Assessment and Plan / ED Course  I have reviewed the triage vital signs and the nursing notes.  Pertinent labs & imaging results that were available during my care of the patient were reviewed by me and considered in my medical decision making (see chart for details).     ZHAMIR PIRRO is a 63 year old male with history of hypertension, migraines who presents to the ED with headache.  Patient with unremarkable vitals.  No fever.  Patient with headache that started just prior to arrival.  Patient states headache about an 8 out of 10.  Has had migraines in the past similar.  States that he gets  them about once a year.  States that he has some floaters in bilateral eyes which is consistent with prior migraines.  Denies any numbness, tingling, weakness, speech change.  Patient has normal neurological exam.  20/50 vision bilaterally with readers on.  No visual field deficit.  No concern for stroke as his neuro exam appears normal.  Patient is able to ambulate without any issues.  Will get a head CT as headache came on fairly suddenly.  Will rule out any bleeding as headache started within 2 hours ago.  Will give Compazine, Benadryl, Decadron, Tylenol for headache.  Recommend that he get his eyes checked by an optometrist as I believe that having glasses will help with his overall vision.  CT head was unremarkable.  Patient felt better after medications.  Suspect migraine headache.  No concern for subarachnoid hemorrhage or stroke.  Patient was discharged in ED in good condition.  Recommend follow-up primary care doctor.  This chart was dictated using voice recognition software.  Despite best efforts to proofread,  errors can occur which can change the documentation meaning.    Final Clinical Impressions(s) / ED Diagnoses   Final diagnoses:  Migraine without aura and without status migrainosus, not intractable    ED Discharge Orders    None       Lennice Sites, DO 05/27/19 0825

## 2019-05-27 NOTE — ED Triage Notes (Signed)
Headache and blurry vision that started suddenly on the way to work this morning.  Sts he gets a migraine like this "about once a year" and "sometimes it worse and I can't see nothing."  Sts he was diagnosed with HTN 5 wks ago and has been taking med since then. Pt has not taken anything for his HA.

## 2019-05-27 NOTE — ED Notes (Signed)
ED Provider at bedside. 

## 2019-10-01 ENCOUNTER — Emergency Department (HOSPITAL_BASED_OUTPATIENT_CLINIC_OR_DEPARTMENT_OTHER): Payer: Self-pay

## 2019-10-01 ENCOUNTER — Other Ambulatory Visit: Payer: Self-pay

## 2019-10-01 ENCOUNTER — Emergency Department (HOSPITAL_BASED_OUTPATIENT_CLINIC_OR_DEPARTMENT_OTHER)
Admission: EM | Admit: 2019-10-01 | Discharge: 2019-10-01 | Disposition: A | Discharge status: Home / Self Care | Payer: Self-pay | Attending: Emergency Medicine | Admitting: Emergency Medicine

## 2019-10-01 ENCOUNTER — Encounter (HOSPITAL_BASED_OUTPATIENT_CLINIC_OR_DEPARTMENT_OTHER): Payer: Self-pay

## 2019-10-01 DIAGNOSIS — Z880 Allergy status to penicillin: Secondary | ICD-10-CM | POA: Insufficient documentation

## 2019-10-01 DIAGNOSIS — I10 Essential (primary) hypertension: Secondary | ICD-10-CM | POA: Insufficient documentation

## 2019-10-01 DIAGNOSIS — Z79899 Other long term (current) drug therapy: Secondary | ICD-10-CM | POA: Insufficient documentation

## 2019-10-01 DIAGNOSIS — M545 Low back pain, unspecified: Secondary | ICD-10-CM

## 2019-10-01 DIAGNOSIS — M5136 Other intervertebral disc degeneration, lumbar region: Secondary | ICD-10-CM | POA: Insufficient documentation

## 2019-10-01 DIAGNOSIS — F17210 Nicotine dependence, cigarettes, uncomplicated: Secondary | ICD-10-CM | POA: Insufficient documentation

## 2019-10-01 LAB — URINALYSIS, ROUTINE W REFLEX MICROSCOPIC
Bilirubin Urine: NEGATIVE
Glucose, UA: NEGATIVE mg/dL
Ketones, ur: NEGATIVE mg/dL
Leukocytes,Ua: NEGATIVE
Nitrite: NEGATIVE
Protein, ur: NEGATIVE mg/dL
Specific Gravity, Urine: <1.005 — ABNORMAL LOW (ref 1.005–1.030)
pH: 6 (ref 5.0–8.0)

## 2019-10-01 LAB — URINALYSIS, MICROSCOPIC (REFLEX)

## 2019-10-01 MED ORDER — LIDOCAINE 5 % EX PTCH: 1.0000 | MEDICATED_PATCH | CUTANEOUS | 0 refills | Status: DC

## 2019-10-01 MED ORDER — CYCLOBENZAPRINE HCL 10 MG PO TABS: 10.0000 mg | ORAL_TABLET | Freq: Two times a day (BID) | ORAL | 0 refills | Status: DC | PRN

## 2019-10-01 MED ORDER — KETOROLAC TROMETHAMINE 60 MG/2ML IM SOLN
60.0000 mg | Freq: Once | INTRAMUSCULAR | Status: AC
  Administered 2019-10-01: 60 mg via INTRAMUSCULAR
  Filled 2019-10-01: qty 2

## 2019-10-01 NOTE — ED Triage Notes (Signed)
Pt states that he felt a popping in his back yesterday while getting into his car, reports that the pain is worse today than yesterday. Pt used ice with no relief.

## 2019-10-01 NOTE — Discharge Instructions (Signed)
Take Tylenol 1000 mg 4 times a day for 1 week. This is the maximum dose of acetaminophen you can take from all sources. Please check other over-the-counter medications and prescriptions to ensure you are not taking other medications that contain acetaminophen.  You may also take ibuprofen 400 mg 6 times a day alternating with or at the same time as tylenol.  You can take the muscle relaxant at the same time as these other medications.

## 2019-10-01 NOTE — ED Notes (Signed)
Patient transported to X-ray 

## 2019-10-01 NOTE — ED Provider Notes (Signed)
[facility] EMERGENCY DEPARTMENT Provider Note   CSN: [id number] Arrival date & time: 10/01/19  0705     History   Chief Complaint Chief Complaint  Patient presents with  . Back Pain    HPI Cameron [name] is a 63 y.o. male.     HPI   3 days ago was getting into the car, felt a pop Pain in the lower back, lower right side and towards spine Getting worse over the last 3 days, seems like the muscle is not relaxing Tried back exercises, ice, heat, lidocaine, ibuprofen Standing, walking, when first standing up it is the worst  No loss of control of bowel or bladder, no urinary incontinence Burning with urination, lasts 3sec then wears of  No numbness or weakness No recent falls No steroids/IVDU/cancer hx   Past Medical History:  Diagnosis Date  . Anxiety   . Back pain   . Hard of hearing   . Hypertension   . Migraine   . Seasonal allergies     Patient Active Problem List   Diagnosis Date Noted  . Hypertension 04/15/2019    Past Surgical History:  Procedure Laterality Date  . FACIAL FRACTURE SURGERY    . FOOT FRACTURE SURGERY    . FRACTURE SURGERY  2008   L foot fx  . KNEE SURGERY          Home Medications    Prior to Admission medications   Medication Sig Start Date End Date Taking? Authorizing Provider  amLODipine (NORVASC) 10 MG tablet Take 1 tablet (10 mg total) by mouth daily. 04/15/19   Argentina Donovan, PA-C  cetirizine (ZYRTEC) 10 MG tablet Take 1 tablet (10 mg total) by mouth daily for 30 days. 04/03/19 05/03/19  Langston Masker B, PA-C  cyclobenzaprine (FLEXERIL) 10 MG tablet Take 1 tablet (10 mg total) by mouth 2 (two) times daily as needed for muscle spasms. 10/01/19   Gareth Morgan, MD  diphenhydramine-acetaminophen (TYLENOL PM) 25-500 MG TABS Take 1 tablet by mouth at bedtime as needed (pain/sleep).    [provider]  ibuprofen (ADVIL,MOTRIN) 600 MG tablet Take 1 tablet (600 mg total) by mouth every 6 (six) hours as  needed. Patient not taking: Reported on 04/15/2019 02/08/16   Ward, Ozella Almond, PA-C  lidocaine (LIDODERM) 5 % Place 1 patch onto the skin daily. Remove & Discard patch within 12 hours or as directed by MD 10/01/19   Gareth Morgan, MD    Family History No family history on file.  Social History Social History   Tobacco Use  . Smoking status: Current Every Day Smoker    Packs/day: 0.50    Types: Cigarettes  . Smokeless tobacco: Never Used  Substance Use Topics  . Alcohol use: No  . Drug use: No     Allergies   Penicillins   Review of Systems Review of Systems  Constitutional: Negative for fever.  HENT: Negative for sore throat.   Eyes: Negative for visual disturbance.  Respiratory: Negative for shortness of breath.   Cardiovascular: Negative for chest pain.  Gastrointestinal: Negative for abdominal pain, nausea and vomiting.  Genitourinary: Positive for dysuria. Negative for difficulty urinating.  Musculoskeletal: Positive for back pain. Negative for neck stiffness.  Skin: Negative for rash.  Neurological: Negative for syncope, weakness, numbness and headaches.     Physical Exam Updated Vital Signs BP (S) (!) 149/102 (BP Location: Right Arm) Comment: MD aware, pt states he will tkae his BP medication when he  gets home today   Pulse 77   Temp 97.7 F (36.5 C) (Oral)   Resp 18   Ht 6\' 1"  (1.854 m)   Wt 68.8 kg   SpO2 100%   BMI 20.01 kg/m   Physical Exam Vitals signs and nursing note reviewed.  Constitutional:      General: He is not in acute distress.    Appearance: He is well-developed. He is not diaphoretic.  HENT:     Head: Normocephalic and atraumatic.  Eyes:     Conjunctiva/sclera: Conjunctivae normal.  Neck:     Musculoskeletal: Normal range of motion.  Cardiovascular:     Rate and Rhythm: Normal rate and regular rhythm.  Pulmonary:     Effort: Pulmonary effort is normal. No respiratory distress.  Abdominal:     General: There is no  distension.     Palpations: Abdomen is soft.     Tenderness: There is no abdominal tenderness. There is no guarding.  Musculoskeletal:        General: Tenderness (lumbar spine and towards right) present.     Comments: 5/5 strength LE and normal sensation  Skin:    General: Skin is warm and dry.  Neurological:     Mental Status: He is alert and oriented to person, place, and time.      ED Treatments / Results  Labs (all labs ordered are listed, but only abnormal results are displayed) Labs Reviewed  URINE CULTURE - Abnormal; Notable for the following components:      Result Value   Culture   (*)    Value: <10,000 COLONIES/mL INSIGNIFICANT GROWTH Performed at [facility], [address] [place], [place] [id number]    All other components within normal limits  URINALYSIS, ROUTINE W REFLEX MICROSCOPIC - Abnormal; Notable for the following components:   Specific Gravity, Urine <1.005 (*)    Hgb urine dipstick TRACE (*)    All other components within normal limits  URINALYSIS, MICROSCOPIC (REFLEX) - Abnormal; Notable for the following components:   Bacteria, UA RARE (*)    All other components within normal limits    EKG None  Radiology Dg Lumbar Spine Complete  Result Date: 10/01/2019 CLINICAL DATA:  Pt having low back pain with right leg pain for 2 days with no injuryback pain EXAM: LUMBAR SPINE - COMPLETE 4+ VIEW COMPARISON:  None. FINDINGS: Multiple levels of mild endplate spurring. No acute loss vertebral height or disc height. No subluxation. Atherosclerotic calcification of the aorta. IMPRESSION: 1. Mild disc osteophytic disease. 2. No acute findings Electronically Signed   By: Genevive BiStewart  Edmunds M.D.   On: 10/01/2019 08:33    Procedures Procedures (including critical care time)  Medications Ordered in ED Medications  ketorolac (TORADOL) injection 60 mg (60 mg Intramuscular Given 10/01/19 0818)     Initial Impression / Assessment and Plan / ED Course  I  have reviewed the triage vital signs and the nursing notes.  Pertinent labs & imaging results that were available during my care of the patient were reviewed by me and considered in my medical decision making (see chart for details).        63yo male with history of htn presents with concern for right lower back pain for 3 days.  Patient has a normal neurologic exam and denies any urinary retention or overflow incontinence, stool incontinence, saddle anesthesia, fever, IV drug use, trauma, chronic steroid use or immunocompromise and have low suspicion suspicion for cauda equina, fracture, epidural  abscess, or vertebral osteomyelitis.  XR without evidence of fracture/lytic lesion.  Describes some dysuria, UA without infection. Hx and exam not consistent with nephrolithiasis, dissection, AAA, or other intraabdominal process.  Hx/exam most consistent with likely muscular strain or possible disc disease. Given rx for muscle relaxant, recommend continued supportive care.  Final Clinical Impressions(s) / ED Diagnoses   Final diagnoses:  Acute right-sided low back pain without sciatica  Lumbar degenerative disc disease    ED Discharge Orders         Ordered    cyclobenzaprine (FLEXERIL) 10 MG tablet  2 times daily PRN     10/01/19 0855    lidocaine (LIDODERM) 5 %  Every 24 hours     10/01/19 0855           Alvira Monday, MD 10/03/19 [phone]

## 2019-10-01 NOTE — ED Notes (Signed)
ED Provider at bedside. 

## 2019-10-02 LAB — URINE CULTURE: Culture: <10000 — AB

## 2020-08-05 ENCOUNTER — Emergency Department (HOSPITAL_BASED_OUTPATIENT_CLINIC_OR_DEPARTMENT_OTHER)
Admission: EM | Admit: 2020-08-05 | Discharge: 2020-08-05 | Disposition: A | Discharge status: Home / Self Care | Payer: Self-pay | Attending: Emergency Medicine | Admitting: Emergency Medicine

## 2020-08-05 ENCOUNTER — Other Ambulatory Visit: Payer: Self-pay

## 2020-08-05 ENCOUNTER — Emergency Department (HOSPITAL_COMMUNITY): Admission: EM | Admit: 2020-08-05 | Discharge: 2020-08-05 | Discharge status: Against Medical Advice | Payer: Self-pay

## 2020-08-05 ENCOUNTER — Encounter (HOSPITAL_BASED_OUTPATIENT_CLINIC_OR_DEPARTMENT_OTHER): Payer: Self-pay | Admitting: Emergency Medicine

## 2020-08-05 DIAGNOSIS — Z79899 Other long term (current) drug therapy: Secondary | ICD-10-CM | POA: Insufficient documentation

## 2020-08-05 DIAGNOSIS — M545 Low back pain, unspecified: Secondary | ICD-10-CM

## 2020-08-05 DIAGNOSIS — I10 Essential (primary) hypertension: Secondary | ICD-10-CM | POA: Insufficient documentation

## 2020-08-05 DIAGNOSIS — F17210 Nicotine dependence, cigarettes, uncomplicated: Secondary | ICD-10-CM | POA: Insufficient documentation

## 2020-08-05 MED ORDER — DEXAMETHASONE 4 MG PO TABS: 4.0000 mg | ORAL_TABLET | Freq: Two times a day (BID) | ORAL | 0 refills | Status: DC

## 2020-08-05 MED ORDER — METHOCARBAMOL 500 MG PO TABS: 500.0000 mg | ORAL_TABLET | Freq: Three times a day (TID) | ORAL | 0 refills | Status: DC | PRN

## 2020-08-05 NOTE — ED Triage Notes (Signed)
Pt reports lower back pain x 4 days, pulling heavy objects . Painful to ambulate . denies numbness or tingling to lower extremities.

## 2020-08-05 NOTE — ED Notes (Signed)
Called to triage x 2 , no answer

## 2020-08-05 NOTE — ED Provider Notes (Signed)
[facility] EMERGENCY DEPARTMENT Provider Note   CSN: [id number] Arrival date & time: 08/05/20  2542     History Chief Complaint  Patient presents with  . Back Pain    Cameron [name] is a 64 y.o. male.  HPI   64 year old male with lower back pain.  Patient injured himself at work on Monday, 08/01/2020.  He hurt his lower back while pulling heavy material.  He has had persistent pain since that time.  Worse with movement.  Does not radiate.  No numbness/tingling/loss of strength.  Past Medical History:  Diagnosis Date  . Anxiety   . Back pain   . Hard of hearing   . Hypertension   . Migraine   . Seasonal allergies     Patient Active Problem List   Diagnosis Date Noted  . Hypertension 04/15/2019    Past Surgical History:  Procedure Laterality Date  . FACIAL FRACTURE SURGERY    . FOOT FRACTURE SURGERY    . FRACTURE SURGERY  2008   L foot fx  . KNEE SURGERY         No family history on file.  Social History   Tobacco Use  . Smoking status: Current Every Day Smoker    Packs/day: 0.50    Types: Cigarettes  . Smokeless tobacco: Never Used  Substance Use Topics  . Alcohol use: No  . Drug use: No    Home Medications Prior to Admission medications   Medication Sig Start Date End Date Taking? Authorizing Provider  diphenhydramine-acetaminophen (TYLENOL PM) 25-500 MG TABS Take 1 tablet by mouth at bedtime as needed (pain/sleep).   Yes [provider]  ibuprofen (ADVIL,MOTRIN) 600 MG tablet Take 1 tablet (600 mg total) by mouth every 6 (six) hours as needed. 02/08/16  Yes Ward, Chase Picket, PA-C  amLODipine (NORVASC) 10 MG tablet Take 1 tablet (10 mg total) by mouth daily. 04/15/19   Anders Simmonds, PA-C  cetirizine (ZYRTEC) 10 MG tablet Take 1 tablet (10 mg total) by mouth daily for 30 days. 04/03/19 05/03/19  Aviva Kluver B, PA-C  cyclobenzaprine (FLEXERIL) 10 MG tablet Take 1 tablet (10 mg total) by mouth 2 (two) times daily as needed for  muscle spasms. 10/01/19   Alvira Monday, MD  lidocaine (LIDODERM) 5 % Place 1 patch onto the skin daily. Remove & Discard patch within 12 hours or as directed by MD 10/01/19   Alvira Monday, MD    Allergies    Penicillins  Review of Systems   Review of Systems All systems reviewed and negative, other than as noted in HPI.  Physical Exam Updated Vital Signs BP (!) 164/104 (BP Location: Right Arm)   Pulse 75   Temp 97.7 F (36.5 C) (Oral)   Resp 18   Ht 6\' 1"  (1.854 m)   Wt 72.6 kg   SpO2 100%   BMI 21.11 kg/m   Physical Exam Vitals and nursing note reviewed.  Constitutional:      General: He is not in acute distress.    Appearance: He is well-developed.  HENT:     Head: Normocephalic and atraumatic.  Eyes:     General:        Right eye: No discharge.        Left eye: No discharge.     Conjunctiva/sclera: Conjunctivae normal.  Cardiovascular:     Rate and Rhythm: Normal rate and regular rhythm.     Heart sounds: Normal heart sounds. No murmur heard.  No friction rub. No gallop.   Pulmonary:     Effort: Pulmonary effort is normal. No respiratory distress.     Breath sounds: Normal breath sounds.  Abdominal:     General: There is no distension.     Palpations: Abdomen is soft.     Tenderness: There is no abdominal tenderness.  Musculoskeletal:        General: Tenderness present.     Cervical back: Neck supple.     Comments: Wearing lower back brace.  Tenderness across the lumbar spine both paraspinally and in the midline.  Can stand unassisted, but movements are slow/deliberate.  Normal strength.  Skin:    General: Skin is warm and dry.  Neurological:     Mental Status: He is alert.  Psychiatric:        Behavior: Behavior normal.        Thought Content: Thought content normal.     ED Results / Procedures / Treatments   Labs (all labs ordered are listed, but only abnormal results are displayed) Labs Reviewed - No data to  display  EKG None  Radiology No results found.  Procedures Procedures (including critical care time)  Medications Ordered in ED Medications - No data to display  ED Course  I have reviewed the triage vital signs and the nursing notes.  Pertinent labs & imaging results that were available during my care of the patient were reviewed by me and considered in my medical decision making (see chart for details).    MDM Rules/Calculators/A&P                          64 year old male with lower back pain.  Symptoms/exam consistent with lumbosacral strain.  No "red flags."  Plan symptomatic treatment.  Final Clinical Impression(s) / ED Diagnoses Final diagnoses:  Acute midline low back pain without sciatica    Rx / DC Orders ED Discharge Orders    None       Raeford Razor, MD 08/05/20 [phone]

## 2021-01-14 ENCOUNTER — Encounter (HOSPITAL_COMMUNITY): Payer: Self-pay | Admitting: Emergency Medicine

## 2021-01-14 ENCOUNTER — Other Ambulatory Visit: Payer: Self-pay

## 2021-01-14 ENCOUNTER — Emergency Department (HOSPITAL_COMMUNITY)
Admission: EM | Admit: 2021-01-14 | Discharge: 2021-01-14 | Disposition: A | Discharge status: Home / Self Care | Payer: HRSA Program | Attending: Emergency Medicine | Admitting: Emergency Medicine

## 2021-01-14 DIAGNOSIS — J3489 Other specified disorders of nose and nasal sinuses: Secondary | ICD-10-CM | POA: Insufficient documentation

## 2021-01-14 DIAGNOSIS — Z20822 Contact with and (suspected) exposure to covid-19: Secondary | ICD-10-CM | POA: Insufficient documentation

## 2021-01-14 DIAGNOSIS — I10 Essential (primary) hypertension: Secondary | ICD-10-CM | POA: Diagnosis not present

## 2021-01-14 DIAGNOSIS — R0981 Nasal congestion: Secondary | ICD-10-CM | POA: Diagnosis present

## 2021-01-14 DIAGNOSIS — Z79899 Other long term (current) drug therapy: Secondary | ICD-10-CM | POA: Insufficient documentation

## 2021-01-14 DIAGNOSIS — F17210 Nicotine dependence, cigarettes, uncomplicated: Secondary | ICD-10-CM | POA: Diagnosis not present

## 2021-01-14 LAB — SARS CORONAVIRUS 2 (TAT 6-24 HRS): SARS Coronavirus 2: NEGATIVE

## 2021-01-14 MED ORDER — FLUTICASONE PROPIONATE 50 MCG/ACT NA SUSP: 2.0000 | Freq: Every day | NASAL | 0 refills | Status: DC

## 2021-01-14 MED ORDER — AMOXICILLIN-POT CLAVULANATE 875-125 MG PO TABS: 1.0000 | ORAL_TABLET | Freq: Two times a day (BID) | ORAL | 0 refills | Status: DC

## 2021-01-14 NOTE — ED Provider Notes (Signed)
[facility]-EMERGENCY DEPT Provider Note   CSN: [id number] Arrival date & time: 01/14/21  7846     History Chief Complaint  Patient presents with  . Nasal Congestion    Cameron [name] is a 65 y.o. male.  HPI Patient is 64 year old male with past medical history significant for HTN, migraines, anxiety, back pain, hard of hearing, seasonal allergies  Patient is presented today with 2 weeks of sinus congestion states is been worsening constant and seems of gotten darker and yellow for the past few days.  He denies any fevers or chills.  He states that he has not taken anything for his symptoms.  He denies any lightheadedness or dizziness.  He does endorse sinus pain and pressure.  He states he is vaccinated x2 and has had his booster.  He denies any shortness of breath cough or chest pain or shortness of breath.  No other associated symptoms.  No aggravating mitigating factors.  He is taking medications prior to arrival.     Past Medical History:  Diagnosis Date  . Anxiety   . Back pain   . Hard of hearing   . Hypertension   . Migraine   . Seasonal allergies     Patient Active Problem List   Diagnosis Date Noted  . Hypertension 04/15/2019    Past Surgical History:  Procedure Laterality Date  . FACIAL FRACTURE SURGERY    . FOOT FRACTURE SURGERY    . FRACTURE SURGERY  2008   L foot fx  . KNEE SURGERY         History reviewed. No pertinent family history.  Social History   Tobacco Use  . Smoking status: Current Every Day Smoker    Packs/day: 0.50    Types: Cigarettes  . Smokeless tobacco: Never Used  Substance Use Topics  . Alcohol use: No  . Drug use: No    Home Medications Prior to Admission medications   Medication Sig Start Date End Date Taking? Authorizing Provider  amoxicillin-clavulanate (AUGMENTIN) 875-125 MG tablet Take 1 tablet by mouth every 12 (twelve) hours. 01/14/21  Yes Selin Eisler S, PA  fluticasone (FLONASE) 50  MCG/ACT nasal spray Place 2 sprays into both nostrils daily for 14 days. 01/14/21 01/28/21 Yes Jahnaya Branscome S, PA  amLODipine (NORVASC) 10 MG tablet Take 1 tablet (10 mg total) by mouth daily. 04/15/19   Anders Simmonds, PA-C  cetirizine (ZYRTEC) 10 MG tablet Take 1 tablet (10 mg total) by mouth daily for 30 days. 04/03/19 05/03/19  Aviva Kluver B, PA-C  cyclobenzaprine (FLEXERIL) 10 MG tablet Take 1 tablet (10 mg total) by mouth 2 (two) times daily as needed for muscle spasms. 10/01/19   Alvira Monday, MD  dexamethasone (DECADRON) 4 MG tablet Take 1 tablet (4 mg total) by mouth 2 (two) times daily. 08/05/20   Raeford Razor, MD  diphenhydramine-acetaminophen (TYLENOL PM) 25-500 MG TABS Take 1 tablet by mouth at bedtime as needed (pain/sleep).    [provider]  ibuprofen (ADVIL,MOTRIN) 600 MG tablet Take 1 tablet (600 mg total) by mouth every 6 (six) hours as needed. 02/08/16   Ward, Chase Picket, PA-C  lidocaine (LIDODERM) 5 % Place 1 patch onto the skin daily. Remove & Discard patch within 12 hours or as directed by MD 10/01/19   Alvira Monday, MD  methocarbamol (ROBAXIN) 500 MG tablet Take 1 tablet (500 mg total) by mouth every 8 (eight) hours as needed for muscle spasms. 08/05/20   Raeford Razor,  MD    Allergies    Penicillins  Review of Systems   Review of Systems  Constitutional: Negative for appetite change, chills, fatigue and fever.  HENT: Positive for congestion, postnasal drip, sinus pressure and sinus pain.   Eyes: Negative for pain.  Respiratory: Negative for cough and shortness of breath.   Cardiovascular: Negative for chest pain and leg swelling.  Gastrointestinal: Negative for abdominal pain and vomiting.  Genitourinary: Negative for dysuria.  Musculoskeletal: Negative for myalgias.  Skin: Negative for rash.  Neurological: Negative for dizziness and headaches.    Physical Exam Updated Vital Signs BP (!) 162/103 (BP Location: Left Arm)   Pulse (!) 101    Temp 98.2 F (36.8 C) (Oral)   Resp 18   Ht 6\' 1"  (1.854 m)   Wt 75.3 kg   SpO2 100%   BMI 21.90 kg/m   Physical Exam Vitals and nursing note reviewed.  Constitutional:      General: He is not in acute distress. HENT:     Head: Normocephalic and atraumatic.     Nose: Congestion present.     Comments: Bilateral nares with boggy mucosa and thick yellow mucus present.  Notable tenderness to palpation of the right maxillary sinus.  No frontal or left maxillary tenderness evaluation.    Mouth/Throat:     Mouth: Mucous membranes are dry.  Eyes:     General: No scleral icterus. Cardiovascular:     Rate and Rhythm: Normal rate and regular rhythm.     Pulses: Normal pulses.     Heart sounds: Normal heart sounds.  Pulmonary:     Effort: Pulmonary effort is normal. No respiratory distress.     Breath sounds: No wheezing.  Abdominal:     Palpations: Abdomen is soft.     Tenderness: There is no abdominal tenderness.  Musculoskeletal:     Cervical back: Normal range of motion.     Right lower leg: No edema.     Left lower leg: No edema.  Skin:    General: Skin is warm and dry.     Capillary Refill: Capillary refill takes less than 2 seconds.  Neurological:     Mental Status: He is alert. Mental status is at baseline.  Psychiatric:        Mood and Affect: Mood normal.        Behavior: Behavior normal.     ED Results / Procedures / Treatments   Labs (all labs ordered are listed, but only abnormal results are displayed) Labs Reviewed  SARS CORONAVIRUS 2 (TAT 6-24 HRS)    EKG None  Radiology No results found.  Procedures Procedures   Medications Ordered in ED Medications - No data to display  ED Course  I have reviewed the triage vital signs and the nursing notes.  Pertinent labs & imaging results that were available during my care of the patient were reviewed by me and considered in my medical decision making (see chart for details).    MDM  Rules/Calculators/A&P                          Patient is 65 year old male fully vaccinated and boosted in the ER today with congestion for 2 weeks. Physical exam is notable for right maxillary tenderness to palpation.  He has had no fevers however he does meet IDSA guidelines for treating with Augmentin for her sinusitis.  I have relatively low suspicion for Covid given the timeline  of his symptoms however will test for Covid as well.  Patient denies any other symptoms specifically denies any chest pain or shortness of breath, cough, lightheadedness, fatigue or malaise.  He states he otherwise feels quite well.  Vital signs are notable for hypertension pulse is 98 on my examination.  Patient understands need to follow-up with primary care doctor to review his blood pressure and his follow-up from today.  He is agreeable to this.  Given return precautions.  No hypoxia.  Cameron [name] was evaluated in Emergency Department on 01/14/2021 for the symptoms described in the history of present illness. He was evaluated in the context of the global COVID-19 pandemic, which necessitated consideration that the patient might be at risk for infection with the SARS-CoV-2 virus that causes COVID-19. Institutional protocols and algorithms that pertain to the evaluation of patients at risk for COVID-19 are in a state of rapid change based on information released by regulatory bodies including the CDC and federal and state organizations. These policies and algorithms were followed during the patient's care in the ED.  Final Clinical Impression(s) / ED Diagnoses Final diagnoses:  Sinus congestion    Rx / DC Orders ED Discharge Orders         Ordered    amoxicillin-clavulanate (AUGMENTIN) 875-125 MG tablet  Every 12 hours        01/14/21 1003    fluticasone (FLONASE) 50 MCG/ACT nasal spray  Daily        01/14/21 1003           Solon Augusta Alvin, [place] 01/14/21 1012    Linwood Dibbles, MD 01/15/21 [phone]

## 2021-01-14 NOTE — ED Triage Notes (Signed)
Pt states that he has had nasal congestion x 3 days. Denies other symptoms. Employer wants him tested for Covid. Alert and oriented.

## 2021-01-14 NOTE — Discharge Instructions (Addendum)
Please use Flonase as prescribed.  Please use the Augmentin for the entire course even if you begin to feel better several days in.  Please follow-up with your primary care doctor.  Your Covid test pending or is on 24 hours.  Please check MyChart. There are instructions on how to download this app attached to this document.

## 2021-05-11 ENCOUNTER — Other Ambulatory Visit: Payer: Self-pay

## 2021-05-11 ENCOUNTER — Emergency Department (HOSPITAL_COMMUNITY)
Admission: EM | Admit: 2021-05-11 | Discharge: 2021-05-11 | Disposition: A | Discharge status: Home / Self Care | Payer: Self-pay | Attending: Emergency Medicine | Admitting: Emergency Medicine

## 2021-05-11 ENCOUNTER — Encounter (HOSPITAL_COMMUNITY): Payer: Self-pay

## 2021-05-11 DIAGNOSIS — F17210 Nicotine dependence, cigarettes, uncomplicated: Secondary | ICD-10-CM | POA: Insufficient documentation

## 2021-05-11 DIAGNOSIS — I10 Essential (primary) hypertension: Secondary | ICD-10-CM | POA: Insufficient documentation

## 2021-05-11 DIAGNOSIS — M79605 Pain in left leg: Secondary | ICD-10-CM

## 2021-05-11 DIAGNOSIS — Z79899 Other long term (current) drug therapy: Secondary | ICD-10-CM | POA: Insufficient documentation

## 2021-05-11 DIAGNOSIS — I8392 Asymptomatic varicose veins of left lower extremity: Secondary | ICD-10-CM | POA: Insufficient documentation

## 2021-05-11 NOTE — ED Provider Notes (Signed)
[facility]-EMERGENCY DEPT Provider Note   CSN: [id number] Arrival date & time: 05/11/21  3716     History Chief Complaint  Patient presents with  . Leg Pain    Cameron [name] is a 65 y.o. male who presents for evaluation of bilateral leg pain, left greater than right.  Reports history of varicose veins and states that he works and has to stand on his feet for 14 hours a day.  He reports that after working all day, his pain is worse.  He states it is better if he sits and elevates his legs.  He has not been wearing compression socks.  No trauma, injury, fall.  He states it hurts more in the calf where his varicose veins are worse.  He has not noted any overlying warmth, erythema, edema.  No fevers, numbness/weakness. He denies any exogenous hormone use, recent immobilization, prior history of DVT/PE, recent surgery,or long travel.  The history is provided by the patient.       Past Medical History:  Diagnosis Date  . Anxiety   . Back pain   . Hard of hearing   . Hypertension   . Migraine   . Seasonal allergies     Patient Active Problem List   Diagnosis Date Noted  . Hypertension 04/15/2019    Past Surgical History:  Procedure Laterality Date  . FACIAL FRACTURE SURGERY    . FOOT FRACTURE SURGERY    . FRACTURE SURGERY  2008   L foot fx  . KNEE SURGERY         History reviewed. No pertinent family history.  Social History   Tobacco Use  . Smoking status: Current Every Day Smoker    Packs/day: 0.50    Types: Cigarettes  . Smokeless tobacco: Never Used  Substance Use Topics  . Alcohol use: No  . Drug use: No    Home Medications Prior to Admission medications   Medication Sig Start Date End Date Taking? Authorizing Provider  amLODipine (NORVASC) 10 MG tablet Take 1 tablet (10 mg total) by mouth daily. 04/15/19   Anders Simmonds, PA-C  amoxicillin-clavulanate (AUGMENTIN) 875-125 MG tablet Take 1 tablet by mouth every 12 (twelve) hours.  01/14/21   Gailen Shelter, PA  cetirizine (ZYRTEC) 10 MG tablet Take 1 tablet (10 mg total) by mouth daily for 30 days. 04/03/19 05/03/19  Aviva Kluver B, PA-C  cyclobenzaprine (FLEXERIL) 10 MG tablet Take 1 tablet (10 mg total) by mouth 2 (two) times daily as needed for muscle spasms. 10/01/19   Alvira Monday, MD  dexamethasone (DECADRON) 4 MG tablet Take 1 tablet (4 mg total) by mouth 2 (two) times daily. 08/05/20   Raeford Razor, MD  diphenhydramine-acetaminophen (TYLENOL PM) 25-500 MG TABS Take 1 tablet by mouth at bedtime as needed (pain/sleep).    [provider]  fluticasone (FLONASE) 50 MCG/ACT nasal spray Place 2 sprays into both nostrils daily for 14 days. 01/14/21 01/28/21  Gailen Shelter, PA  ibuprofen (ADVIL,MOTRIN) 600 MG tablet Take 1 tablet (600 mg total) by mouth every 6 (six) hours as needed. 02/08/16   Ward, Chase Picket, PA-C  lidocaine (LIDODERM) 5 % Place 1 patch onto the skin daily. Remove & Discard patch within 12 hours or as directed by MD 10/01/19   Alvira Monday, MD  methocarbamol (ROBAXIN) 500 MG tablet Take 1 tablet (500 mg total) by mouth every 8 (eight) hours as needed for muscle spasms. 08/05/20   Raeford Razor, MD  Allergies    Penicillins  Review of Systems   Review of Systems  Constitutional: Negative for fever.  Musculoskeletal:       Leg pain  Neurological: Negative for weakness and numbness.  All other systems reviewed and are negative.   Physical Exam Updated Vital Signs BP (!) 149/108   Pulse 82   Temp 97.9 F (36.6 C) (Oral)   Resp 18   Ht 6' (1.829 m)   Wt 67.6 kg   SpO2 100%   BMI 20.21 kg/m   Physical Exam Vitals and nursing note reviewed.  Constitutional:      Appearance: He is well-developed.  HENT:     Head: Normocephalic and atraumatic.  Eyes:     General: No scleral icterus.       Right eye: No discharge.        Left eye: No discharge.     Conjunctiva/sclera: Conjunctivae normal.  Cardiovascular:      Pulses:          Dorsalis pedis pulses are 2+ on the right side and 2+ on the left side.  Pulmonary:     Effort: Pulmonary effort is normal.  Musculoskeletal:     Comments: Tenderness palpation noted to the left calf with overlying varicose veins.  No overlying warmth, erythema, edema.  Flexion/tension of the left foot intact without any difficulty.  No swelling noted to the foot, ankle.  He can wiggle all 5 toes without any difficulty.  No tenderness palpation in his right lower extremity.  Skin:    General: Skin is warm and dry.     Comments: Good distal cap refill. LLE is not dusky in appearance or cool to touch.  Neurological:     Mental Status: He is alert.     Comments: Sensation intact along major nerve distributions of BLE  Psychiatric:        Speech: Speech normal.        Behavior: Behavior normal.     ED Results / Procedures / Treatments   Labs (all labs ordered are listed, but only abnormal results are displayed) Labs Reviewed - No data to display  EKG None  Radiology No results found.  Procedures Procedures   Medications Ordered in ED Medications - No data to display  ED Course  I have reviewed the triage vital signs and the nursing notes.  Pertinent labs & imaging results that were available during my care of the patient were reviewed by me and considered in my medical decision making (see chart for details).    MDM Rules/Calculators/A&P                          65 y.o. M who presents for evaluation of LLE pain.  Reports a history of varicose veins and states that he has to work rest to stand up on his feet all day long.  He states after he works, his pain is worse.  It is better if he goes home and elevates his legs.  No fall, trauma, injury.  No overlying warmth, erythema.  No fevers, numbness/weakness.  On initial arrival, he is afebrile nontoxic-appearing.  Vital signs are stable.  He is neurovascularly intact.  On exam, he has diffuse tenderness palpation  of the left lower extremity generally at the left calf.  There is overlying varicose veins.  No overlying warmth, erythema.  No bony tenderness.  Full range of motion without any difficulty.  History/physical exam  not concerning for ischemic limb, septic arthritis.  Suspect his symptoms are likely related to varicose veins and lack of compression support as well as standing on his feet all day.  I did discuss with him regarding him having calf tenderness, mild swelling from varicose veins that we should check an ultrasound here in the ED for evaluation of acute DVT.  Patient states that he does not want to get a ultrasound today.  He states that he was checked with for blood clots in March when he was having this pain and states that it was negative.  He states that he has not had any overlying warmth, erythema.  I discussed with him that if we do not obtain an ultrasound today, we could potentially be missing a blood clot.  Patient expresses understanding of this and wishes to decline ultrasound at this time.  He would like referral as well as work note.  Patient instructed that if anytime, his symptoms get worse, he is to return to emergency department immediately. Patient had ample opportunity for questions and discussion. All patient's questions were answered with full understanding. Strict return precautions discussed. Patient expresses understanding and agreement to plan.   Portions of this note were generated with Scientist, clinical (histocompatibility and immunogenetics). Dictation errors may occur despite best attempts at proofreading.  Final Clinical Impression(s) / ED Diagnoses Final diagnoses:  Left leg pain  Varicose veins of left lower extremity, unspecified whether complicated    Rx / DC Orders ED Discharge Orders    None       Maxwell Caul, PA-C 05/11/21 4709    Mancel Bale, MD 05/12/21 [phone]

## 2021-05-11 NOTE — ED Triage Notes (Signed)
Pt arrives POV with L Leg pain. Pt sates he is on his feet approx 14hrs per day and his pain is unbearable.

## 2021-05-11 NOTE — Discharge Instructions (Signed)
It is important that you wear compression socks when you are walking.  Please elevate your legs.  Follow-up with the vein doctor.  I also provided you referral to [facility] which she can use to establish primary care doctor.  Return to emergency department for any worsening pain, redness or swelling of the leg, chest pain, difficulty breathing or any other worsening concerning symptoms.

## 2021-08-16 ENCOUNTER — Emergency Department (HOSPITAL_COMMUNITY)
Admission: EM | Admit: 2021-08-16 | Discharge: 2021-08-16 | Disposition: A | Discharge status: Home / Self Care | Payer: Self-pay | Attending: Emergency Medicine | Admitting: Emergency Medicine

## 2021-08-16 ENCOUNTER — Encounter (HOSPITAL_COMMUNITY): Payer: Self-pay | Admitting: *Deleted

## 2021-08-16 ENCOUNTER — Other Ambulatory Visit: Payer: Self-pay

## 2021-08-16 DIAGNOSIS — Z79899 Other long term (current) drug therapy: Secondary | ICD-10-CM | POA: Insufficient documentation

## 2021-08-16 DIAGNOSIS — I10 Essential (primary) hypertension: Secondary | ICD-10-CM | POA: Insufficient documentation

## 2021-08-16 DIAGNOSIS — F17210 Nicotine dependence, cigarettes, uncomplicated: Secondary | ICD-10-CM | POA: Insufficient documentation

## 2021-08-16 DIAGNOSIS — I83812 Varicose veins of left lower extremities with pain: Secondary | ICD-10-CM | POA: Insufficient documentation

## 2021-08-16 DIAGNOSIS — M79605 Pain in left leg: Secondary | ICD-10-CM

## 2021-08-16 DIAGNOSIS — I83819 Varicose veins of unspecified lower extremities with pain: Secondary | ICD-10-CM

## 2021-08-16 NOTE — Discharge Instructions (Addendum)
You will need to keep your legs elevated after a day of work. Continue with treating your pain with tylenol or ibuprofen.   You will need to continue wearing your compression socks as well.

## 2021-08-16 NOTE — ED Triage Notes (Signed)
Pt complains of lower left leg pain. Hx of injury to left foot and varicose veins in calf

## 2021-08-16 NOTE — ED Provider Notes (Signed)
[facility]-EMERGENCY DEPT Provider Note   CSN: [id number] Arrival date & time: 08/16/21  0703     History Chief Complaint  Patient presents with   Leg Pain    Cameron [name] is a 65 y.o. [name].  65 y.o [name] with a PMH of Anxiety, HTN presents to the ED with a chief complaint of left leg pain x yesterday. Patient states since prior surgical intervention to his left foot, he's has ongoing pain of his varicose veins. He reports the pain is exacerbated with working.  He reports standing for work for approximately 9 to 10 hours daily.  Reports the pain is exacerbated after days worth of work.  He does wear compression socks, does take ibuprofen and Tylenol for symptomatic relief.  He is here mainly requesting some time off work for improvement in his symptoms. He denies any shortness of breath, chest pain, or other complaints.   The history is provided by the patient.  Leg Pain Location:  Leg Time since incident:  1 day Injury: no   Leg location:  L lower leg Associated symptoms: no back pain and no fever       Past Medical History:  Diagnosis Date   Anxiety    Back pain    Hard of hearing    Hypertension    Migraine    Seasonal allergies     Patient Active Problem List   Diagnosis Date Noted   Hypertension 04/15/2019    Past Surgical History:  Procedure Laterality Date   FACIAL FRACTURE SURGERY     FOOT FRACTURE SURGERY     FRACTURE SURGERY  2008   L foot fx   KNEE SURGERY         No family history on file.  Social History   Tobacco Use   Smoking status: Every Day    Packs/day: 0.50    Types: Cigarettes   Smokeless tobacco: Never  Substance Use Topics   Alcohol use: No   Drug use: No    Home Medications Prior to Admission medications   Medication Sig Start Date End Date Taking? Authorizing Provider  amLODipine (NORVASC) 10 MG tablet Take 1 tablet (10 mg total) by mouth daily. 04/15/19   Anders Simmonds, PA-C   amoxicillin-clavulanate (AUGMENTIN) 875-125 MG tablet Take 1 tablet by mouth every 12 (twelve) hours. 01/14/21   Gailen Shelter, PA  cetirizine (ZYRTEC) 10 MG tablet Take 1 tablet (10 mg total) by mouth daily for 30 days. 04/03/19 05/03/19  Aviva Kluver B, PA-C  cyclobenzaprine (FLEXERIL) 10 MG tablet Take 1 tablet (10 mg total) by mouth 2 (two) times daily as needed for muscle spasms. 10/01/19   Alvira Monday, MD  dexamethasone (DECADRON) 4 MG tablet Take 1 tablet (4 mg total) by mouth 2 (two) times daily. 08/05/20   Raeford Razor, MD  diphenhydramine-acetaminophen (TYLENOL PM) 25-500 MG TABS Take 1 tablet by mouth at bedtime as needed (pain/sleep).    [provider]  fluticasone (FLONASE) 50 MCG/ACT nasal spray Place 2 sprays into both nostrils daily for 14 days. 01/14/21 01/28/21  Gailen Shelter, PA  ibuprofen (ADVIL,MOTRIN) 600 MG tablet Take 1 tablet (600 mg total) by mouth every 6 (six) hours as needed. 02/08/16   Ward, Chase Picket, PA-C  lidocaine (LIDODERM) 5 % Place 1 patch onto the skin daily. Remove & Discard patch within 12 hours or as directed by MD 10/01/19   Alvira Monday, MD  methocarbamol (ROBAXIN) 500 MG tablet  Take 1 tablet (500 mg total) by mouth every 8 (eight) hours as needed for muscle spasms. 08/05/20   Raeford Razor, MD    Allergies    Penicillins  Review of Systems   Review of Systems  Constitutional:  Negative for chills and fever.  Respiratory:  Negative for shortness of breath.   Cardiovascular:  Negative for chest pain and leg swelling.  Gastrointestinal:  Negative for abdominal pain.  Genitourinary:  Negative for flank pain.  Musculoskeletal:  Negative for back pain.  Neurological:  Negative for light-headedness and headaches.   Physical Exam Updated Vital Signs BP (!) 177/128 (BP Location: Left Arm)   Pulse 72   Temp 97.8 F (36.6 C) (Oral)   Resp 16   SpO2 100%   Physical Exam Vitals and nursing note reviewed.  Constitutional:       Appearance: Normal appearance.  HENT:     Head: Normocephalic and atraumatic.     Mouth/Throat:     Mouth: Mucous membranes are moist.  Eyes:     Pupils: Pupils are equal, round, and reactive to light.  Cardiovascular:     Rate and Rhythm: Normal rate.  Pulmonary:     Effort: Pulmonary effort is normal.  Abdominal:     General: Abdomen is flat.  Musculoskeletal:     Cervical back: Normal range of motion and neck supple.     Comments: Multiple varicose veins present. No pain with palpation of the calf.   Neurological:     Mental Status: He is alert and oriented to person, place, and time.    ED Results / Procedures / Treatments   Labs (all labs ordered are listed, but only abnormal results are displayed) Labs Reviewed - No data to display  EKG None  Radiology No results found.  Procedures Procedures   Medications Ordered in ED Medications - No data to display  ED Course  I have reviewed the triage vital signs and the nursing notes.  Pertinent labs & imaging results that were available during my care of the patient were reviewed by me and considered in my medical decision making (see chart for details).    MDM Rules/Calculators/A&P    65 year old [name] with a chief complaint of left leg pain status post surgical intervention several years ago when he developed varicose vein to his left calf.  The pain is exacerbated after a workday.  He is here requesting a note for his work as he reports the pain has intensified in the last couple of days.  During my evaluation he is overall nontoxic, not there is no pain with palpation along the calf, however there is pain with palpation of varicose veins. There is no bleeding source, no erythema noted. He is ambulatory in the ED with a steady gait.  We discussed continued symptomatic treatment with compression socks, over-the-counter Tylenol and ibuprofen to help with pain control.  He is agreeable to this at this time, patient is  stable for discharge.   Portions of this note were generated with Scientist, clinical (histocompatibility and immunogenetics). Dictation errors may occur despite best attempts at proofreading.  Final Clinical Impression(s) / ED Diagnoses Final diagnoses:  Left leg pain  Varicose veins with pain    Rx / DC Orders ED Discharge Orders     None        Claude Manges, PA-C 08/16/21 0756    Ernie Avena, MD 08/16/21 [phone]

## 2021-09-07 ENCOUNTER — Emergency Department (HOSPITAL_COMMUNITY)
Admission: EM | Admit: 2021-09-07 | Discharge: 2021-09-07 | Disposition: A | Discharge status: Home / Self Care | Payer: Medicare Other | Attending: Emergency Medicine | Admitting: Emergency Medicine

## 2021-09-07 ENCOUNTER — Encounter (HOSPITAL_COMMUNITY): Payer: Self-pay

## 2021-09-07 ENCOUNTER — Other Ambulatory Visit: Payer: Self-pay

## 2021-09-07 DIAGNOSIS — Z5321 Procedure and treatment not carried out due to patient leaving prior to being seen by health care provider: Secondary | ICD-10-CM | POA: Insufficient documentation

## 2021-09-07 DIAGNOSIS — R2242 Localized swelling, mass and lump, left lower limb: Secondary | ICD-10-CM | POA: Insufficient documentation

## 2021-09-07 NOTE — ED Notes (Signed)
Pt currently not visible and did not respond to call in ED lobby. Called x1.

## 2021-09-07 NOTE — ED Notes (Signed)
Pt currently not visible and did not respond to name call in ED lobby. Called x2.

## 2021-09-07 NOTE — ED Notes (Signed)
Pt called x3. Currently did not respond and not present in ED lobby. RN notified.

## 2021-09-07 NOTE — ED Triage Notes (Signed)
Pt reports left foot pain and swelling over the past few days. He reports left ankle surgery years ago. He reports increased pain and swelling when putting weight on his foot for long periods of time. He reports being on his feet a lot for work.

## 2021-09-11 ENCOUNTER — Emergency Department (HOSPITAL_COMMUNITY)
Admission: EM | Admit: 2021-09-11 | Discharge: 2021-09-11 | Discharge status: Against Medical Advice | Payer: Medicare Other

## 2021-11-17 ENCOUNTER — Encounter (HOSPITAL_BASED_OUTPATIENT_CLINIC_OR_DEPARTMENT_OTHER): Payer: Self-pay

## 2021-11-17 ENCOUNTER — Emergency Department (HOSPITAL_BASED_OUTPATIENT_CLINIC_OR_DEPARTMENT_OTHER): Payer: Medicare Other

## 2021-11-17 ENCOUNTER — Other Ambulatory Visit: Payer: Self-pay

## 2021-11-17 ENCOUNTER — Emergency Department (HOSPITAL_BASED_OUTPATIENT_CLINIC_OR_DEPARTMENT_OTHER)
Admission: EM | Admit: 2021-11-17 | Discharge: 2021-11-17 | Disposition: A | Discharge status: Home / Self Care | Payer: Medicare Other | Attending: Emergency Medicine | Admitting: Emergency Medicine

## 2021-11-17 DIAGNOSIS — I10 Essential (primary) hypertension: Secondary | ICD-10-CM | POA: Insufficient documentation

## 2021-11-17 DIAGNOSIS — X501XXA Overexertion from prolonged static or awkward postures, initial encounter: Secondary | ICD-10-CM | POA: Insufficient documentation

## 2021-11-17 DIAGNOSIS — M79672 Pain in left foot: Secondary | ICD-10-CM | POA: Insufficient documentation

## 2021-11-17 DIAGNOSIS — F17210 Nicotine dependence, cigarettes, uncomplicated: Secondary | ICD-10-CM | POA: Insufficient documentation

## 2021-11-17 DIAGNOSIS — Z79899 Other long term (current) drug therapy: Secondary | ICD-10-CM | POA: Insufficient documentation

## 2021-11-17 DIAGNOSIS — R519 Headache, unspecified: Secondary | ICD-10-CM | POA: Diagnosis present

## 2021-11-17 LAB — CBC
HCT: 36.7 % — ABNORMAL LOW (ref 39.0–52.0)
Hemoglobin: 12.6 g/dL — ABNORMAL LOW (ref 13.0–17.0)
MCH: 33.6 pg (ref 26.0–34.0)
MCHC: 34.3 g/dL (ref 30.0–36.0)
MCV: 97.9 fL (ref 80.0–100.0)
Platelets: 169 10*3/uL (ref 150–400)
RBC: 3.75 MIL/uL — ABNORMAL LOW (ref 4.22–5.81)
RDW: 15.5 % (ref 11.5–15.5)
WBC: 5.5 10*3/uL (ref 4.0–10.5)
nRBC: 0 % (ref 0.0–0.2)

## 2021-11-17 LAB — TROPONIN I (HIGH SENSITIVITY): Troponin I (High Sensitivity): 3 ng/L (ref <18)

## 2021-11-17 LAB — BASIC METABOLIC PANEL
Anion gap: 7 (ref 5–15)
BUN: 16 mg/dL (ref 8–23)
CO2: 25 mmol/L (ref 22–32)
Calcium: 8.7 mg/dL — ABNORMAL LOW (ref 8.9–10.3)
Chloride: 104 mmol/L (ref 98–111)
Creatinine, Ser: 0.93 mg/dL (ref 0.61–1.24)
GFR, Estimated: >60 mL/min (ref >60)
Glucose, Bld: 98 mg/dL (ref 70–99)
Potassium: 3.9 mmol/L (ref 3.5–5.1)
Sodium: 136 mmol/L (ref 135–145)

## 2021-11-17 MED ORDER — AMLODIPINE BESYLATE 10 MG PO TABS: 10.0000 mg | ORAL_TABLET | Freq: Every day | ORAL | 0 refills | Status: DC

## 2021-11-17 NOTE — ED Notes (Signed)
No acute distress noted upon this RN's departure of patient. Verified discharge paperwork with name and DOB. Vital signs stable. Patient taken to checkout window. Discharge paperwork discussed with patient - Patient refused wc. No further questions voiced upon discharge.

## 2021-11-17 NOTE — ED Provider Notes (Signed)
[facility] EMERGENCY DEPARTMENT Provider Note   CSN: [id number] Arrival date & time: 11/17/21  2248     History Chief Complaint  Patient presents with   Foot Pain    Cameron [name] is a 65 y.o. male.   Foot Pain Associated symptoms include chest pain and headaches. Pertinent negatives include no abdominal pain and no shortness of breath. Patient with left foot and ankle pain.  On 2 weeks ago he stepped down 2 stairs and twisted his ankle.  Continued pain.  States it is worse because he has had to stand on his feet for 8 to 9 hours a day and steel toed boots at work.  Worse with walking.  Also hypertensive here.  Has been out of his blood pressure medicines for a few days.  States he did not have insurance.  States he has it now.  States he does get dizzy at times.  States at times he will get some chest pain also.  Not necessary associated with each other.  No pain today.  States he will feel dizzy.  States he will feel drunk at times.  States he will come and go and happen a couple times a day.  Also will get headaches at times.  Does not drink alcohol.  States he now has insurance and his going to see a new doctor.     Past Medical History:  Diagnosis Date   Anxiety    Back pain    Hard of hearing    Hypertension    Migraine    Seasonal allergies     Patient Active Problem List   Diagnosis Date Noted   Hypertension 04/15/2019    Past Surgical History:  Procedure Laterality Date   FACIAL FRACTURE SURGERY     FOOT FRACTURE SURGERY     FRACTURE SURGERY  2008   L foot fx   KNEE SURGERY         History reviewed. No pertinent family history.  Social History   Tobacco Use   Smoking status: Every Day    Packs/day: 0.50    Types: Cigarettes   Smokeless tobacco: Never  Substance Use Topics   Alcohol use: No   Drug use: No    Home Medications Prior to Admission medications   Medication Sig Start Date End Date Taking? Authorizing Provider  amLODipine  (NORVASC) 10 MG tablet Take 1 tablet (10 mg total) by mouth daily. 11/17/21   Benjiman Core, MD  amoxicillin-clavulanate (AUGMENTIN) 875-125 MG tablet Take 1 tablet by mouth every 12 (twelve) hours. 01/14/21   Gailen Shelter, PA  cetirizine (ZYRTEC) 10 MG tablet Take 1 tablet (10 mg total) by mouth daily for 30 days. 04/03/19 05/03/19  Aviva Kluver B, PA-C  cyclobenzaprine (FLEXERIL) 10 MG tablet Take 1 tablet (10 mg total) by mouth 2 (two) times daily as needed for muscle spasms. 10/01/19   Alvira Monday, MD  dexamethasone (DECADRON) 4 MG tablet Take 1 tablet (4 mg total) by mouth 2 (two) times daily. 08/05/20   Raeford Razor, MD  diphenhydramine-acetaminophen (TYLENOL PM) 25-500 MG TABS Take 1 tablet by mouth at bedtime as needed (pain/sleep).    [provider]  fluticasone (FLONASE) 50 MCG/ACT nasal spray Place 2 sprays into both nostrils daily for 14 days. 01/14/21 01/28/21  Gailen Shelter, PA  ibuprofen (ADVIL,MOTRIN) 600 MG tablet Take 1 tablet (600 mg total) by mouth every 6 (six) hours as needed. 02/08/16   Ward, Chase Picket, PA-C  lidocaine (LIDODERM) 5 % Place 1 patch onto the skin daily. Remove & Discard patch within 12 hours or as directed by MD 10/01/19   Alvira Monday, MD  methocarbamol (ROBAXIN) 500 MG tablet Take 1 tablet (500 mg total) by mouth every 8 (eight) hours as needed for muscle spasms. 08/05/20   Raeford Razor, MD    Allergies    Penicillins  Review of Systems   Review of Systems  Constitutional:  Negative for appetite change.  HENT:  Negative for congestion.   Respiratory:  Negative for shortness of breath.   Cardiovascular:  Positive for chest pain.  Gastrointestinal:  Negative for abdominal pain.  Genitourinary:  Negative for flank pain.  Musculoskeletal:  Positive for gait problem.  Neurological:  Positive for dizziness and headaches.  Hematological:  Negative for adenopathy.  Psychiatric/Behavioral:  Negative for confusion.    Physical  Exam Updated Vital Signs BP (!) 153/93   Pulse 71   Temp 98.1 F (36.7 C) (Oral)   Resp 16   Ht 6\' 1"  (1.854 m)   Wt 76.2 kg   SpO2 98%   BMI 22.16 kg/m   Physical Exam Vitals and nursing note reviewed.  Constitutional:      Appearance: Normal appearance.  Eyes:     Pupils: Pupils are equal, round, and reactive to light.  Cardiovascular:     Rate and Rhythm: Regular rhythm.  Pulmonary:     Breath sounds: No wheezing or rhonchi.  Abdominal:     Tenderness: There is no abdominal tenderness.  Musculoskeletal:        General: Tenderness present.     Cervical back: Neck supple.     Comments: Tenderness over left foot.  Particularly lower midfoot.  Also ankle tenderness bilaterally.  No proximal fibular tenderness.  No deformity.  Skin:    Capillary Refill: Capillary refill takes less than 2 seconds.  Neurological:     Mental Status: He is alert and oriented to person, place, and time.    ED Results / Procedures / Treatments   Labs (all labs ordered are listed, but only abnormal results are displayed) Labs Reviewed  BASIC METABOLIC PANEL - Abnormal; Notable for the following components:      Result Value   Calcium 8.7 (*)    All other components within normal limits  CBC - Abnormal; Notable for the following components:   RBC 3.75 (*)    Hemoglobin 12.6 (*)    HCT 36.7 (*)    All other components within normal limits  TROPONIN I (HIGH SENSITIVITY)    EKG EKG Interpretation  Date/Time:  Friday November 17 2021 08:23:17 EST Ventricular Rate:  74 PR Interval:  150 QRS Duration: 94 QT Interval:  424 QTC Calculation: 471 R Axis:   67 Text Interpretation: Sinus rhythm Confirmed by 02-21-2006 [phone]) on 11/17/2021 8:43:02 AM  Radiology DG Chest 2 View  Result Date: 11/17/2021 CLINICAL DATA:  Chest pain, hypertension EXAM: CHEST - 2 VIEW COMPARISON:  04/03/2019 chest radiograph. FINDINGS: Stable cardiomediastinal silhouette with normal heart size. No  pneumothorax. No pleural effusion. Lungs appear clear, with no acute consolidative airspace disease and no pulmonary edema. IMPRESSION: No active cardiopulmonary disease. Electronically Signed   By: 04/05/2019 M.D.   On: 11/17/2021 08:14   DG Ankle Complete Left  Result Date: 11/17/2021 CLINICAL DATA:  Lateral left ankle pain/s/p trip and fall EXAM: LEFT ANKLE COMPLETE - 3+ VIEW COMPARISON:  None. FINDINGS: Intact surgical plate with interlocking screws in  the lateral left calcaneus with no evidence of hardware loosening. No acute osseous fracture. No subluxation. Tiny plantar left calcaneal spur. No suspicious focal osseous lesions. IMPRESSION: 1. No acute osseous fracture or subluxation in the left ankle. 2. Intact surgical hardware in the lateral left calcaneus without evidence of hardware complication. Electronically Signed   By: Delbert Phenix M.D.   On: 11/17/2021 08:16   CT Head Wo Contrast  Result Date: 11/17/2021 CLINICAL DATA:  Headache EXAM: CT HEAD WITHOUT CONTRAST TECHNIQUE: Contiguous axial images were obtained from the base of the skull through the vertex without intravenous contrast. COMPARISON:  CT head 05/27/2019 FINDINGS: Brain: No acute intracranial hemorrhage, mass effect, or herniation. No extra-axial fluid collections. No evidence of acute territorial infarct. No hydrocephalus. Patchy hypodensities in the periventricular and subcortical white matter, likely secondary to chronic microvascular ischemic changes. Vascular: No hyperdense vessel or unexpected calcification. Skull: Normal. Negative for fracture or focal lesion. Sinuses/Orbits: No acute finding. Other: None. IMPRESSION: Chronic changes with no acute intracranial process identified. Electronically Signed   By: Jannifer Hick M.D.   On: 11/17/2021 08:19   DG Foot Complete Left  Result Date: 11/17/2021 CLINICAL DATA:  trip and fall 2 weeks prior-lateral left ankle pain EXAM: LEFT FOOT - COMPLETE 3+ VIEW COMPARISON:  None.  FINDINGS: Surgical plate with interlocking screws in the lateral left calcaneus with no evidence of hardware fracture or loosening. No acute osseous fracture. No dislocation. No suspicious focal osseous lesions. Tiny plantar left calcaneal spur. No significant arthropathy. IMPRESSION: No acute osseous abnormality. Surgical hardware in the lateral left calcaneus without evidence of hardware complication. Tiny plantar left calcaneal spur. Electronically Signed   By: Delbert Phenix M.D.   On: 11/17/2021 08:19    Procedures Procedures   Medications Ordered in ED Medications - No data to display  ED Course  I have reviewed the triage vital signs and the nursing notes.  Pertinent labs & imaging results that were available during my care of the patient were reviewed by me and considered in my medical decision making (see chart for details).    MDM Rules/Calculators/A&P                           Patient with foot pain after injury.  Tripped stepping out of his house.  Has had chronic injury to that foot.  X-ray reassuring.  However is hypertensive.  Also having episodes of dizziness and chest pain.  Has been noncompliant with his medication.  Head CT done due to dizziness and is reassuring.  Good kidney function.  He hemoglobin slightly low.  Can follow-up with PCP.  Will discharge home.  Started back on his home medication. Final Clinical Impression(s) / ED Diagnoses Final diagnoses:  Hypertension, unspecified type  Foot pain, left    Rx / DC Orders ED Discharge Orders          Ordered    amLODipine (NORVASC) 10 MG tablet  Daily        11/17/21 1006             Benjiman Core, MD 11/17/21 1524

## 2021-11-17 NOTE — ED Triage Notes (Addendum)
First contact with patient. Patient arrived via triage from home with complaints of left lateral ankle pain x 2 weeks ago when he had a trip and fall coming out of his house. Patient was not seen after fall - denies LOC. Pt is A&OX 4. +PSCM noted - Patient refused wc. Patient states he ran out of his BP medications 3 days ago. Respirations even/unlabored. Patient placed on monitor and call light within reach. Patient updated on plan of care. Will continue to monitor patient.

## 2022-02-13 IMAGING — CR DG FOOT COMPLETE 3+V*L*
3 series · 3 of 3 positions shown · non-contrast
Comparison: None.

CLINICAL DATA: trip and fall 2 weeks prior-lateral left ankle pain

EXAM:
LEFT FOOT - COMPLETE 3+ VIEW

[t foot lat left]
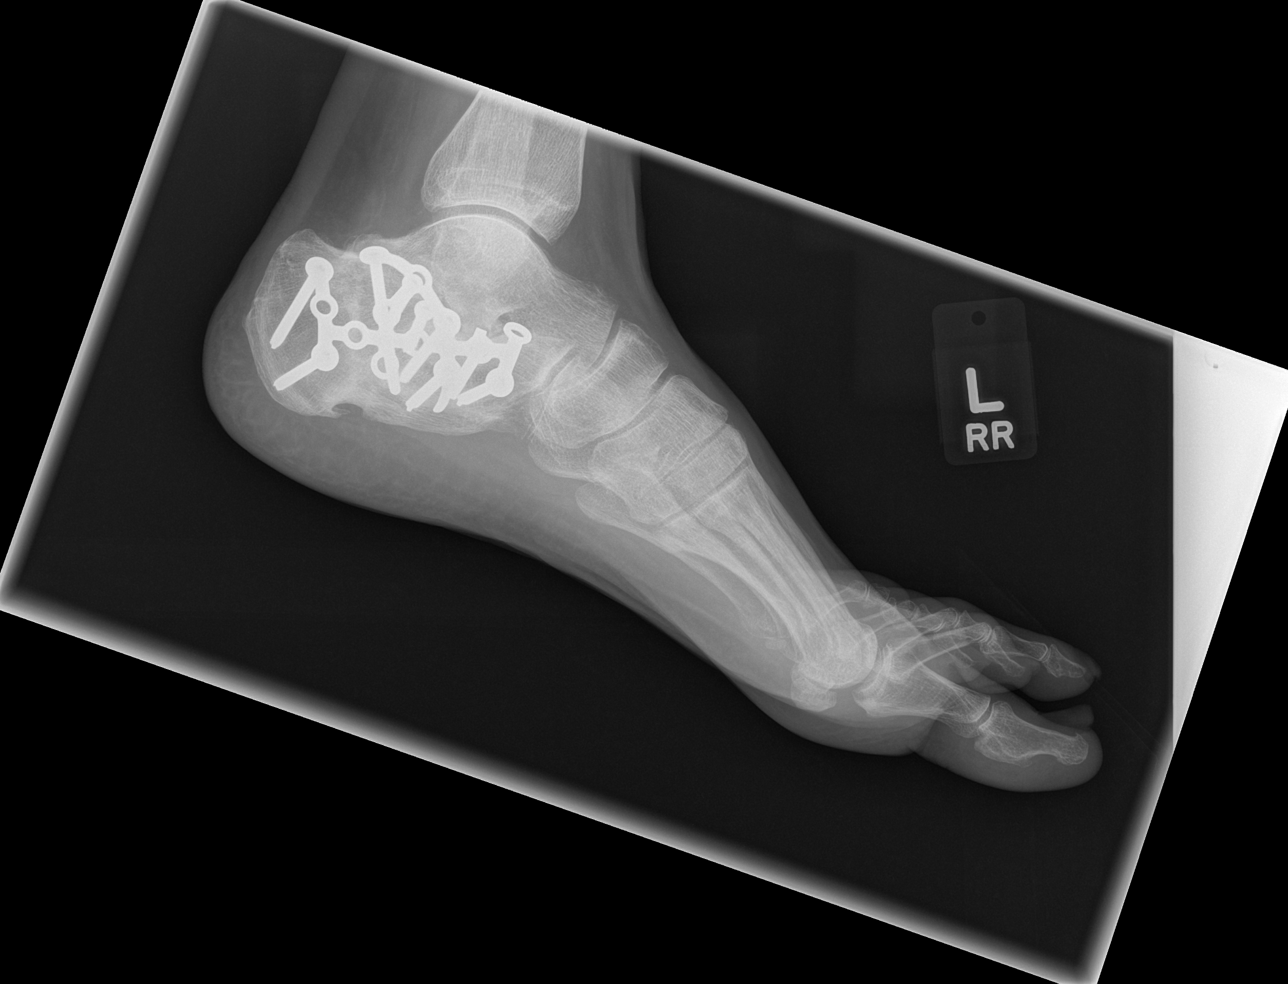

[t foot oblique left]
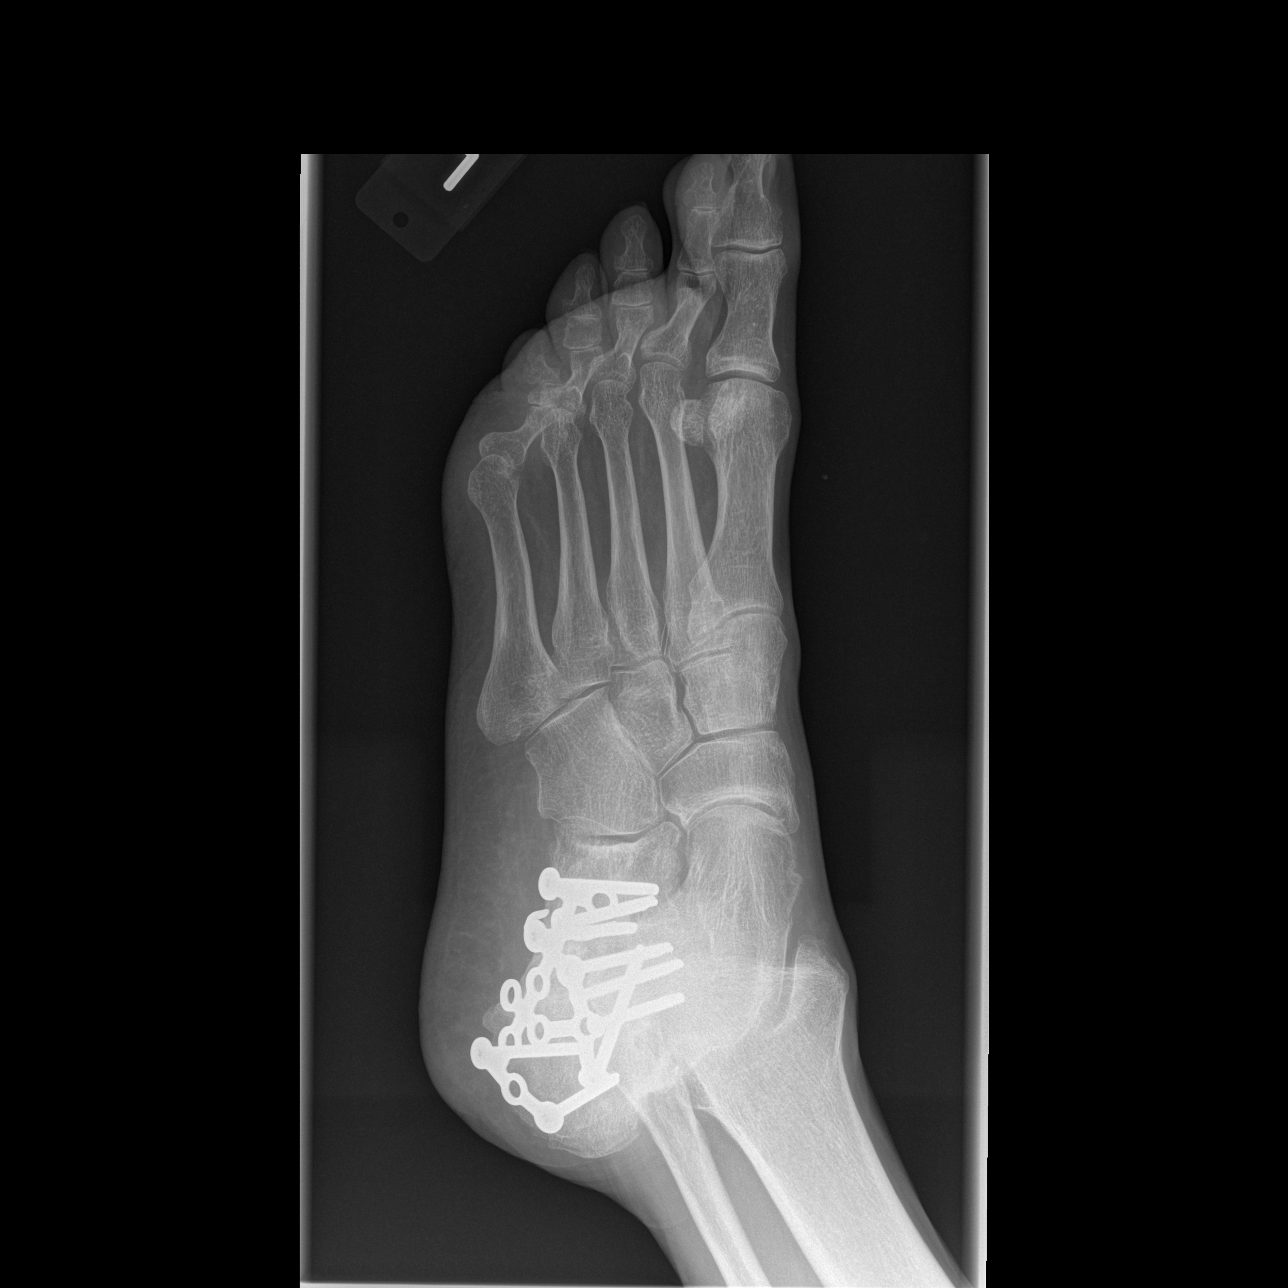

[t foot ap left]
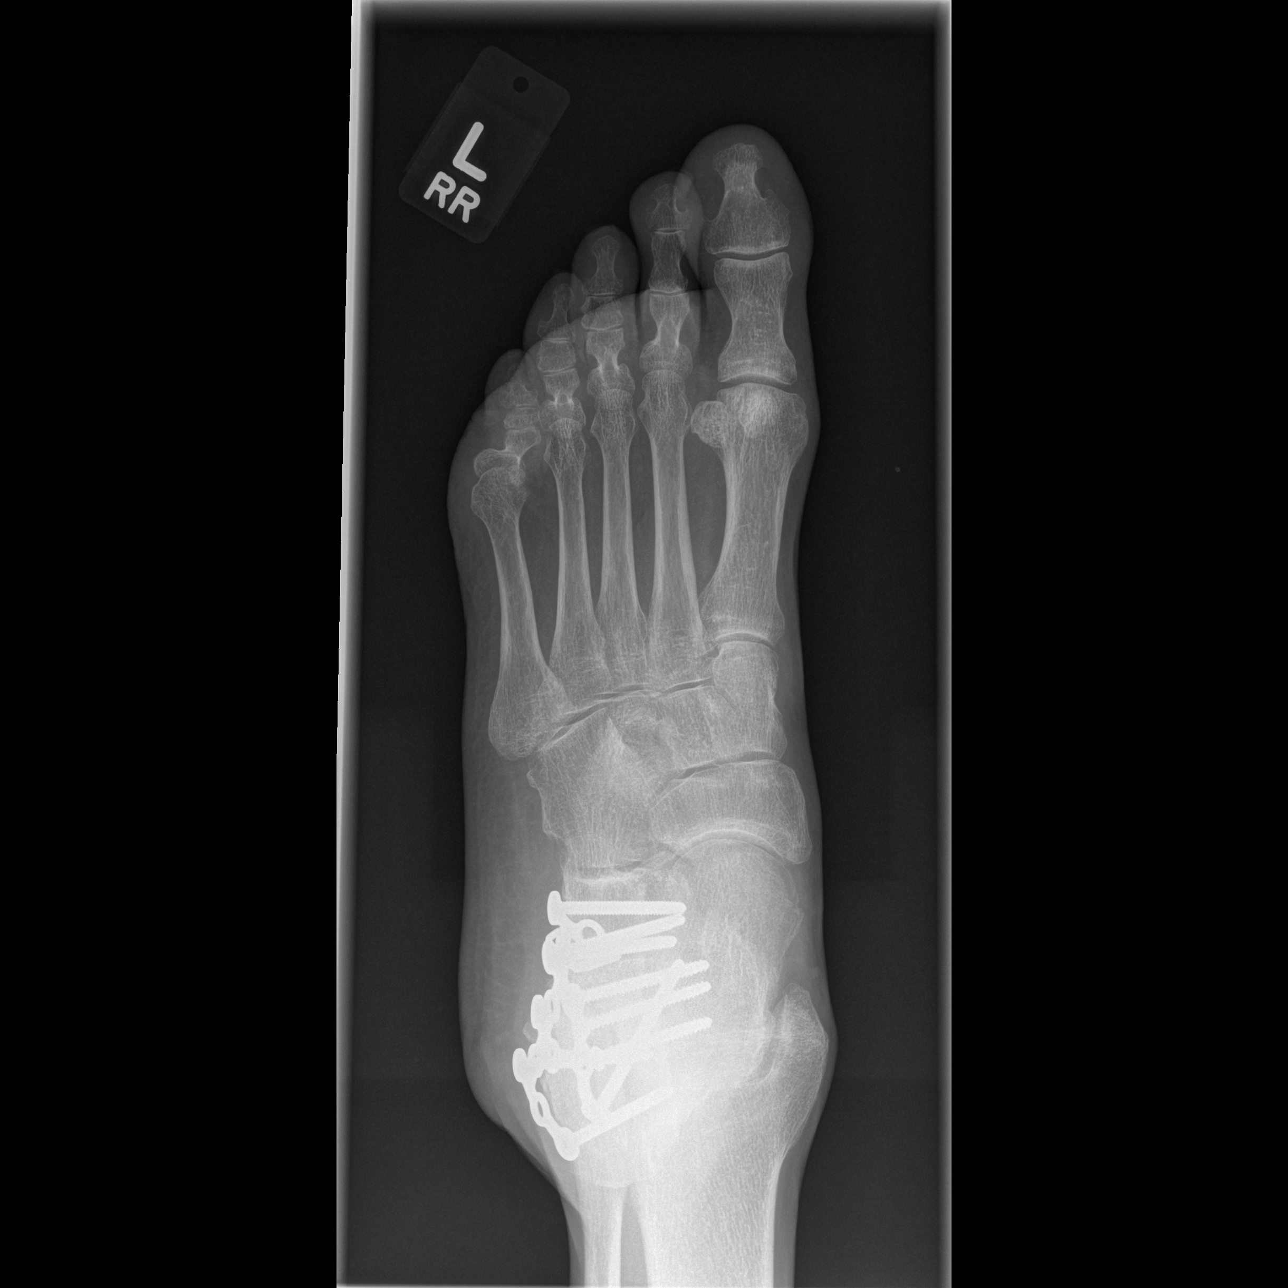

[3 of 3 positions shown; findings below may reference images not displayed]

FINDINGS: Surgical plate with interlocking screws in the lateral left
calcaneus with no evidence of hardware fracture or loosening. No
acute osseous fracture. No dislocation. No suspicious focal osseous
lesions. Tiny plantar left calcaneal spur. No significant
arthropathy.
IMPRESSION: No acute osseous abnormality. Surgical hardware in the lateral left
calcaneus without evidence of hardware complication. Tiny plantar
left calcaneal spur.

## 2022-06-18 ENCOUNTER — Encounter (HOSPITAL_BASED_OUTPATIENT_CLINIC_OR_DEPARTMENT_OTHER): Payer: Self-pay

## 2022-06-18 ENCOUNTER — Other Ambulatory Visit: Payer: Self-pay

## 2022-06-18 ENCOUNTER — Emergency Department (HOSPITAL_BASED_OUTPATIENT_CLINIC_OR_DEPARTMENT_OTHER)
Admission: EM | Admit: 2022-06-18 | Discharge: 2022-06-18 | Disposition: A | Discharge status: Home / Self Care | Payer: Medicare Other | Attending: Emergency Medicine | Admitting: Emergency Medicine

## 2022-06-18 DIAGNOSIS — M5442 Lumbago with sciatica, left side: Secondary | ICD-10-CM | POA: Diagnosis not present

## 2022-06-18 DIAGNOSIS — M545 Low back pain, unspecified: Secondary | ICD-10-CM | POA: Diagnosis present

## 2022-06-18 MED ORDER — IBUPROFEN 200 MG PO TABS
600.0000 mg | ORAL_TABLET | Freq: Once | ORAL | Status: AC
  Administered 2022-06-18: 600 mg via ORAL
  Filled 2022-06-18: qty 1

## 2022-06-18 MED ORDER — LIDOCAINE 5 % EX PTCH
1.0000 | MEDICATED_PATCH | CUTANEOUS | Status: DC
  Administered 2022-06-18: 1 via TRANSDERMAL
  Filled 2022-06-18: qty 1

## 2022-06-18 MED ORDER — ACETAMINOPHEN 325 MG PO TABS: 650.0000 mg | ORAL_TABLET | Freq: Once | ORAL | Status: DC

## 2022-06-18 MED ORDER — OXYCODONE-ACETAMINOPHEN 5-325 MG PO TABS
1.0000 | ORAL_TABLET | Freq: Four times a day (QID) | ORAL | 0 refills | Status: DC | PRN
Start: 2022-06-18 — End: 2024-06-29

## 2022-06-18 MED ORDER — DEXAMETHASONE 4 MG PO TABS
10.0000 mg | ORAL_TABLET | Freq: Once | ORAL | Status: AC
  Administered 2022-06-18: 10 mg via ORAL
  Filled 2022-06-18: qty 3

## 2022-06-18 MED ORDER — TIZANIDINE HCL 4 MG PO TABS: 4.0000 mg | ORAL_TABLET | Freq: Four times a day (QID) | ORAL | 0 refills | Status: DC | PRN

## 2022-06-18 NOTE — ED Provider Notes (Signed)
[facility] EMERGENCY DEPARTMENT Provider Note   CSN: [id number] Arrival date & time: 06/18/22  3845     History  Chief Complaint  Patient presents with   Back Pain    Cameron [name] is a 66 y.o. male.  Patient presents ER chief complaint of left-sided lower back pain rating down to the left knee.  He states that he has a history of chronic back pain that intermittently bothers him but he had been doing well until this morning.  He was out changing his flat tire when he suddenly stood up and felt a popping sensation in his left lower back pain rating down to his left side.  Denies any new numbness or weakness.  Denies any bowel or bladder dysfunction.  Complaining of worsening pain when he moves a certain way.       Home Medications Prior to Admission medications   Medication Sig Start Date End Date Taking? Authorizing Provider  oxyCODONE-acetaminophen (PERCOCET/ROXICET) 5-325 MG tablet Take 1 tablet by mouth every 6 (six) hours as needed for severe pain. 06/18/22  Yes Pearley Millington, Eustace Moore, MD  tiZANidine (ZANAFLEX) 4 MG tablet Take 1 tablet (4 mg total) by mouth every 6 (six) hours as needed for muscle spasms. 06/18/22  Yes Cheryll Cockayne, MD  amLODipine (NORVASC) 10 MG tablet Take 1 tablet (10 mg total) by mouth daily. 11/17/21   Benjiman Core, MD  amoxicillin-clavulanate (AUGMENTIN) 875-125 MG tablet Take 1 tablet by mouth every 12 (twelve) hours. 01/14/21   Gailen Shelter, PA  cetirizine (ZYRTEC) 10 MG tablet Take 1 tablet (10 mg total) by mouth daily for 30 days. 04/03/19 05/03/19  Aviva Kluver B, PA-C  dexamethasone (DECADRON) 4 MG tablet Take 1 tablet (4 mg total) by mouth 2 (two) times daily. 08/05/20   Raeford Razor, MD  diphenhydramine-acetaminophen (TYLENOL PM) 25-500 MG TABS Take 1 tablet by mouth at bedtime as needed (pain/sleep).    [provider]  fluticasone (FLONASE) 50 MCG/ACT nasal spray Place 2 sprays into both nostrils daily for 14 days. 01/14/21  01/28/21  Gailen Shelter, PA  ibuprofen (ADVIL,MOTRIN) 600 MG tablet Take 1 tablet (600 mg total) by mouth every 6 (six) hours as needed. 02/08/16   Ward, Chase Picket, PA-C  lidocaine (LIDODERM) 5 % Place 1 patch onto the skin daily. Remove & Discard patch within 12 hours or as directed by MD 10/01/19   Alvira Monday, MD      Allergies    Penicillins    Review of Systems   Review of Systems  Constitutional:  Negative for fever.  HENT:  Negative for ear pain and sore throat.   Eyes:  Negative for pain.  Respiratory:  Negative for cough.   Cardiovascular:  Negative for chest pain.  Gastrointestinal:  Negative for abdominal pain.  Genitourinary:  Negative for flank pain.  Musculoskeletal:  Positive for back pain.  Skin:  Negative for color change and rash.  Neurological:  Negative for syncope.  All other systems reviewed and are negative.   Physical Exam Updated Vital Signs BP (!) 154/99   Pulse 85   Temp 98.9 F (37.2 C) (Oral)   Resp 20   Ht 6\' 1"  (1.854 m)   Wt 74.8 kg   SpO2 98%   BMI 21.77 kg/m  Physical Exam Constitutional:      Appearance: He is well-developed.  HENT:     Head: Normocephalic.     Nose: Nose normal.  Eyes:  Extraocular Movements: Extraocular movements intact.  Cardiovascular:     Rate and Rhythm: Normal rate.  Pulmonary:     Effort: Pulmonary effort is normal.  Musculoskeletal:     Comments: L4-5 left buttock region tenderness.  Positive straight leg test in the left lower extremity.  Skin:    Coloration: Skin is not jaundiced.  Neurological:     General: No focal deficit present.     Mental Status: He is alert. Mental status is at baseline.     Cranial Nerves: No cranial nerve deficit.     Motor: No weakness.     ED Results / Procedures / Treatments   Labs (all labs ordered are listed, but only abnormal results are displayed) Labs Reviewed - No data to display  EKG None  Radiology No results  found.  Procedures Procedures    Medications Ordered in ED Medications  acetaminophen (TYLENOL) tablet 650 mg (650 mg Oral Not Given 06/18/22 0739)  lidocaine (LIDODERM) 5 % 1 patch (1 patch Transdermal Patch Applied 06/18/22 0743)  ibuprofen (ADVIL) tablet 600 mg (600 mg Oral Given 06/18/22 0744)  dexamethasone (DECADRON) tablet 10 mg (10 mg Oral Given 06/18/22 0744)    ED Course/ Medical Decision Making/ A&P                           Medical Decision Making Risk OTC drugs. Prescription drug management.   Review of records shows ER visit November 17, 2021 for hypertension.  Cardiac monitoring showing sinus rhythm.  Patient given Tylenol Motrin and Decadron.  Lidocaine patch also applied to his lower back.  We will recommend outpatient follow-up with his primary care doctor this week.  Recommend immediate return for worsening symptoms new numbness weakness or any additional concerns.        Final Clinical Impression(s) / ED Diagnoses Final diagnoses:  Acute left-sided low back pain with left-sided sciatica    Rx / DC Orders ED Discharge Orders          Ordered    oxyCODONE-acetaminophen (PERCOCET/ROXICET) 5-325 MG tablet  Every 6 hours PRN        06/18/22 0752    tiZANidine (ZANAFLEX) 4 MG tablet  Every 6 hours PRN        06/18/22 0752              Cheryll Cockayne, MD 06/18/22 [phone]

## 2022-06-18 NOTE — ED Triage Notes (Signed)
Pt reports back pain, stooped over, felt pop, has chronic back issues for several years.  Pt able to transfer wheelchair to bed with steady gait.  Took tylenol at 0630.

## 2022-06-18 NOTE — Discharge Instructions (Signed)
Call your primary care doctor or specialist as discussed in the next 2-3 days.   Return immediately back to the ER if:  Your symptoms worsen within the next 12-24 hours. You develop new symptoms such as new fevers, persistent vomiting, new pain, shortness of breath, or new weakness or numbness, or if you have any other concerns.  

## 2022-06-26 ENCOUNTER — Encounter (HOSPITAL_BASED_OUTPATIENT_CLINIC_OR_DEPARTMENT_OTHER): Payer: Self-pay

## 2022-06-26 ENCOUNTER — Emergency Department (HOSPITAL_BASED_OUTPATIENT_CLINIC_OR_DEPARTMENT_OTHER)
Admission: EM | Admit: 2022-06-26 | Discharge: 2022-06-26 | Disposition: A | Discharge status: Home / Self Care | Payer: Medicare Other | Attending: Emergency Medicine | Admitting: Emergency Medicine

## 2022-06-26 DIAGNOSIS — I10 Essential (primary) hypertension: Secondary | ICD-10-CM | POA: Insufficient documentation

## 2022-06-26 DIAGNOSIS — M545 Low back pain, unspecified: Secondary | ICD-10-CM | POA: Insufficient documentation

## 2022-06-26 DIAGNOSIS — Z79899 Other long term (current) drug therapy: Secondary | ICD-10-CM | POA: Insufficient documentation

## 2022-06-26 MED ORDER — STERILE WATER FOR INJECTION IJ SOLN
INTRAMUSCULAR | Status: AC
  Administered 2022-06-26: 10 mL
  Filled 2022-06-26: qty 10

## 2022-06-26 MED ORDER — AMLODIPINE BESYLATE 10 MG PO TABS: 10.0000 mg | ORAL_TABLET | Freq: Every day | ORAL | 0 refills | Status: AC

## 2022-06-26 MED ORDER — METHYLPREDNISOLONE SODIUM SUCC 125 MG IJ SOLR
125.0000 mg | Freq: Once | INTRAMUSCULAR | Status: AC
Start: 2022-06-26 — End: 2022-06-26
  Administered 2022-06-26: 125 mg via INTRAMUSCULAR
  Filled 2022-06-26: qty 2

## 2022-06-26 MED ORDER — KETOROLAC TROMETHAMINE 15 MG/ML IJ SOLN
15.0000 mg | Freq: Once | INTRAMUSCULAR | Status: AC
Start: 2022-06-26 — End: 2022-06-26
  Administered 2022-06-26: 15 mg via INTRAMUSCULAR
  Filled 2022-06-26: qty 1

## 2022-06-26 MED ORDER — TIZANIDINE HCL 4 MG PO TABS: 4.0000 mg | ORAL_TABLET | Freq: Four times a day (QID) | ORAL | 0 refills | Status: DC | PRN

## 2022-06-26 MED ORDER — AMLODIPINE BESYLATE 5 MG PO TABS
10.0000 mg | ORAL_TABLET | Freq: Once | ORAL | Status: AC
  Administered 2022-06-26: 10 mg via ORAL
  Filled 2022-06-26: qty 2

## 2022-06-26 NOTE — ED Notes (Signed)
Hasnt taken antihypertensive meds in a week, ran out.

## 2022-06-26 NOTE — Discharge Instructions (Addendum)
Please follow-up with a spine specialist.  I have attached 2 offices to these papers that you may call for an appointment.  Please see your primary care about your hypertension and you may also discuss your chronic back troubles with them as well.  Remember that tizanidine may make you drowsy, do not drive on it.  You may use ibuprofen and Tylenol in alternation for any further pain.  Please return with any worsening symptoms, especially fevers, chills, difficulty walking, bowel/bladder dysfunction or numbness in your pelvic area.

## 2022-06-26 NOTE — ED Triage Notes (Signed)
Left lower back pain, worse with movement. Arrives with back brace on. Hx of chronic back issues. Denies urinary sx

## 2022-06-26 NOTE — ED Notes (Signed)
Reviewed discharge instructions and referrals with patient . Educated regarding BP med and need to find PCP. Pt states understanding. Ambulatory for discharge

## 2022-06-26 NOTE — ED Provider Notes (Signed)
[facility] EMERGENCY DEPARTMENT Provider Note   CSN: [id number] Arrival date & time: 06/26/22  3474     History  Chief Complaint  Patient presents with   Back Pain    Cameron [name] is a 66 y.o. male with a past medical history of hypertension and chronic back pain returning with his lower back pain.  Was seen 7 days ago and treated with oral Decadron and discharged with tizanidine and Percocet.  Says he took his last Percocet this morning and is unsure if he is able to drive and work due to continued pain.  Has not seen any specialist.  No bowel/bladder dysfunction, saddle anesthesia, lower extremity weakness, difficulty walking, fever, chills or IVDU.   Back Pain      Home Medications Prior to Admission medications   Medication Sig Start Date End Date Taking? Authorizing Provider  amLODipine (NORVASC) 10 MG tablet Take 1 tablet (10 mg total) by mouth daily. 06/26/22   Rhiann Boucher A, PA-C  amoxicillin-clavulanate (AUGMENTIN) 875-125 MG tablet Take 1 tablet by mouth every 12 (twelve) hours. 01/14/21   Gailen Shelter, PA  cetirizine (ZYRTEC) 10 MG tablet Take 1 tablet (10 mg total) by mouth daily for 30 days. 04/03/19 05/03/19  Aviva Kluver B, PA-C  dexamethasone (DECADRON) 4 MG tablet Take 1 tablet (4 mg total) by mouth 2 (two) times daily. 08/05/20   Raeford Razor, MD  diphenhydramine-acetaminophen (TYLENOL PM) 25-500 MG TABS Take 1 tablet by mouth at bedtime as needed (pain/sleep).    [provider]  fluticasone (FLONASE) 50 MCG/ACT nasal spray Place 2 sprays into both nostrils daily for 14 days. 01/14/21 01/28/21  Gailen Shelter, PA  ibuprofen (ADVIL,MOTRIN) 600 MG tablet Take 1 tablet (600 mg total) by mouth every 6 (six) hours as needed. 02/08/16   Ward, Chase Picket, PA-C  lidocaine (LIDODERM) 5 % Place 1 patch onto the skin daily. Remove & Discard patch within 12 hours or as directed by MD 10/01/19   Alvira Monday, MD  oxyCODONE-acetaminophen  (PERCOCET/ROXICET) 5-325 MG tablet Take 1 tablet by mouth every 6 (six) hours as needed for severe pain. 06/18/22   Cheryll Cockayne, MD  tiZANidine (ZANAFLEX) 4 MG tablet Take 1 tablet (4 mg total) by mouth every 6 (six) hours as needed for muscle spasms. 06/26/22   Harshika Mago A, PA-C      Allergies    Penicillins    Review of Systems   Review of Systems  Musculoskeletal:  Positive for back pain.    Physical Exam Updated Vital Signs BP (!) 178/109   Pulse 72   Temp 98.2 F (36.8 C) (Oral)   Resp 18   Ht 6\' 1"  (1.854 m)   Wt 74.8 kg   SpO2 99%   BMI 21.77 kg/m  Physical Exam Vitals and nursing note reviewed.  Constitutional:      Appearance: Normal appearance.  HENT:     Head: Normocephalic and atraumatic.  Eyes:     General: No scleral icterus.    Conjunctiva/sclera: Conjunctivae normal.  Pulmonary:     Effort: Pulmonary effort is normal. No respiratory distress.  Abdominal:     Tenderness: There is no right CVA tenderness or left CVA tenderness.  Musculoskeletal:        General: Tenderness present. No deformity or signs of injury.     Comments: Full range of motion of all levels of the spine.  Tenderness is midline at the level of L2-S1.  Ambulatory.  Skin:    Findings: No rash.  Neurological:     Mental Status: He is alert.  Psychiatric:        Mood and Affect: Mood normal.     ED Results / Procedures / Treatments   Labs (all labs ordered are listed, but only abnormal results are displayed) Labs Reviewed - No data to display  EKG None  Radiology No results found.  Procedures Procedures   Medications Ordered in ED Medications  amLODipine (NORVASC) tablet 10 mg (has no administration in time range)  methylPREDNISolone sodium succinate (SOLU-MEDROL) 125 mg/2 mL injection 125 mg (125 mg Intramuscular Given 06/26/22 0928)  ketorolac (TORADOL) 15 MG/ML injection 15 mg (15 mg Intramuscular Given 06/26/22 0929)  sterile water (preservative free) injection  (10 mLs  Given 06/26/22 0930)    ED Course/ Medical Decision Making/ A&P                           Medical Decision Making Risk Prescription drug management.   66 year old male presenting with back pain.  Differential includes but is not limited to spinal cord compression, pyelonephritis, muscle strain, disc herniation, fracture  Per chart review, patient was seen for the same thing a week ago.  Has not followed up with specialist per chart review.  Treatment: Given Solu-Medrol injection, Toradol injection and amlodipine due to his persistent hypertension.  Reports he had not taken his antihypertensives in multiple weeks.  Imaging: Patient swears that he had x-rays done last time.  These are not noted in the physician note nor are they in the system.  He does not want more x-rays at this time for financial reasons.  MDM/disposition: Patient needs to follow-up with a spine specialist.  We cannot continue to treat his what may become a chronic pain.  He voiced understanding.  Refilled muscle relaxants and amlodipine.  Will not refill Percocet.  He understands the plan and is agreeable to discharge.   Final Clinical Impression(s) / ED Diagnoses Final diagnoses:  Hypertension, unspecified type    Rx / DC Orders ED Discharge Orders          Ordered    tiZANidine (ZANAFLEX) 4 MG tablet  Every 6 hours PRN        06/26/22 0915    amLODipine (NORVASC) 10 MG tablet  Daily        06/26/22 0915          Results and diagnoses were explained to the patient. Return precautions discussed in full. Patient had no additional questions and expressed complete understanding.   This chart was dictated using voice recognition software.  Despite best efforts to proofread,  errors can occur which can change the documentation meaning.     Woodroe Chen 06/26/22 5974    Alvira Monday, MD 06/27/22 [phone]

## 2022-10-25 ENCOUNTER — Other Ambulatory Visit: Payer: Self-pay

## 2022-10-25 ENCOUNTER — Emergency Department (HOSPITAL_BASED_OUTPATIENT_CLINIC_OR_DEPARTMENT_OTHER)
Admission: EM | Admit: 2022-10-25 | Discharge: 2022-10-25 | Disposition: A | Discharge status: Home / Self Care | Payer: Medicare Other | Attending: Emergency Medicine | Admitting: Emergency Medicine

## 2022-10-25 ENCOUNTER — Encounter (HOSPITAL_BASED_OUTPATIENT_CLINIC_OR_DEPARTMENT_OTHER): Payer: Self-pay | Admitting: Emergency Medicine

## 2022-10-25 DIAGNOSIS — Z0279 Encounter for issue of other medical certificate: Secondary | ICD-10-CM | POA: Insufficient documentation

## 2022-10-25 DIAGNOSIS — Z7689 Persons encountering health services in other specified circumstances: Secondary | ICD-10-CM

## 2022-10-25 NOTE — ED Triage Notes (Signed)
Pt has had URI 2 weeks ago, pt feels better but is requesting to get a return to work note.

## 2022-10-25 NOTE — ED Notes (Signed)
Patient states that he is having no issues today. States that he just need to get a work note

## 2022-10-25 NOTE — ED Provider Notes (Signed)
[facility] EMERGENCY DEPARTMENT Provider Note   CSN: [id number] Arrival date & time: 10/25/22  0754     History  Chief Complaint  Patient presents with   Letter for School/Work    Cameron [name] is a 66 y.o. male with no significant past medical history presenting to the emergency department for medical clearance to return to work.  Patient states he was diagnosed with upper respiratory infection 2 weeks ago.  States he was prescribed antibiotics and has finished it.  States he does not have any symptoms at the moment.  Denies any fever, cough, runny nose, sinus pain, chest pain, shortness of breath, nausea, vomiting, bowel changes, urinary symptoms.  HPI     Home Medications Prior to Admission medications   Medication Sig Start Date End Date Taking? Authorizing Provider  amLODipine (NORVASC) 10 MG tablet Take 1 tablet (10 mg total) by mouth daily. 06/26/22   Redwine, Madison A, PA-C  amoxicillin-clavulanate (AUGMENTIN) 875-125 MG tablet Take 1 tablet by mouth every 12 (twelve) hours. 01/14/21   Gailen Shelter, PA  cetirizine (ZYRTEC) 10 MG tablet Take 1 tablet (10 mg total) by mouth daily for 30 days. 04/03/19 05/03/19  Aviva Kluver B, PA-C  dexamethasone (DECADRON) 4 MG tablet Take 1 tablet (4 mg total) by mouth 2 (two) times daily. 08/05/20   Raeford Razor, MD  diphenhydramine-acetaminophen (TYLENOL PM) 25-500 MG TABS Take 1 tablet by mouth at bedtime as needed (pain/sleep).    [provider]  fluticasone (FLONASE) 50 MCG/ACT nasal spray Place 2 sprays into both nostrils daily for 14 days. 01/14/21 01/28/21  Gailen Shelter, PA  ibuprofen (ADVIL,MOTRIN) 600 MG tablet Take 1 tablet (600 mg total) by mouth every 6 (six) hours as needed. 02/08/16   Ward, Chase Picket, PA-C  lidocaine (LIDODERM) 5 % Place 1 patch onto the skin daily. Remove & Discard patch within 12 hours or as directed by MD 10/01/19   Alvira Monday, MD  oxyCODONE-acetaminophen (PERCOCET/ROXICET)  5-325 MG tablet Take 1 tablet by mouth every 6 (six) hours as needed for severe pain. 06/18/22   Cheryll Cockayne, MD  tiZANidine (ZANAFLEX) 4 MG tablet Take 1 tablet (4 mg total) by mouth every 6 (six) hours as needed for muscle spasms. 06/26/22   Redwine, Madison A, PA-C      Allergies    Penicillins    Review of Systems   Review of Systems  All other systems reviewed and are negative.   Physical Exam Updated Vital Signs BP (!) 148/95 (BP Location: Left Arm)   Pulse (!) 102   Temp 98 F (36.7 C) (Oral)   Resp 20   Ht 6\' 1"  (1.854 m)   Wt 74.8 kg   SpO2 100%   BMI 21.77 kg/m  Physical Exam Vitals and nursing note reviewed.  Constitutional:      Appearance: Normal appearance.  HENT:     Head: Normocephalic and atraumatic.     Mouth/Throat:     Mouth: Mucous membranes are moist.  Eyes:     General: No scleral icterus. Cardiovascular:     Rate and Rhythm: Normal rate and regular rhythm.     Pulses: Normal pulses.     Heart sounds: Normal heart sounds.  Pulmonary:     Effort: Pulmonary effort is normal.     Breath sounds: Normal breath sounds.  Abdominal:     General: Abdomen is flat.     Palpations: Abdomen is soft.  Tenderness: There is no abdominal tenderness.  Musculoskeletal:        General: No deformity.  Skin:    General: Skin is warm.     Findings: No rash.  Neurological:     General: No focal deficit present.     Mental Status: He is alert.  Psychiatric:        Mood and Affect: Mood normal.     ED Results / Procedures / Treatments   Labs (all labs ordered are listed, but only abnormal results are displayed) Labs Reviewed - No data to display  EKG None  Radiology No results found.  Procedures Procedures    Medications Ordered in ED Medications - No data to display  ED Course/ Medical Decision Making/ A&P                           Medical Decision Making  This patient presents to the ED for medical clearance to return to work, this  involves an extensive number of treatment options, and is a complaint that carries with a high risk of complications and morbidity.  The differential diagnosis includes upper respiratory infection.  This is not an exhaustive list.  Comorbidities that complicate the patient evaluation See HPI  Social determinants of health NA  Additional history obtained: Additional history obtained from EMR. External records from outside source obtained and review including prior labs  Cardiac monitoring/EKG: The patient was maintained on a cardiac monitor.  I personally reviewed and interpreted the cardiac monitor which showed an underlying rhythm of: Sinus rhythm.  Lab tests:   Imaging studies:  Problem list/ ED course/ Critical interventions/ Medical management: HPI: See above Vital signs significant for BP 140/95 otherwise within normal range and stable throughout visit. Laboratory/imaging studies significant for: See above. On physical examination, patient is afebrile and appears in no acute distress.  Heart sounds normal.  Lungs are clear.  No wheezing or rales.  Patient states he was diagnosed with upper respiratory infection 2 weeks ago.  Patient was given antibiotics and finished.  He does not have any symptoms at the moment.  No cough, runny nose, sinus pain, fever.  I feel that it is safe for patient to return to work as he medically cleared.  No labs or imaging necessary at the moment.  I will write him a letter to return to work..   I have reviewed the patient home medicines and have made adjustments as needed.  Consultations obtained:  Disposition Continued outpatient therapy. Follow-up with PCP  recommended for reevaluation of symptoms. Treatment plan discussed with patient.  Pt acknowledged understanding was agreeable to the plan. Worrisome signs and symptoms were discussed with patient, and patient acknowledged understanding to return to the ED if they noticed these signs and symptoms.  Patient was stable upon discharge.   This chart was dictated using voice recognition software.  Despite best efforts to proofread,  errors can occur which can change the documentation meaning.          Final Clinical Impression(s) / ED Diagnoses Final diagnoses:  Return to work evaluation    Rx / DC Orders ED Discharge Orders     None         Jeanelle Malling, [place] 10/26/22 [id number]    Glynn Octave, MD 10/26/22 [phone]

## 2022-10-25 NOTE — Discharge Instructions (Addendum)
You can return to work on Monday 10/29/2022.

## 2024-06-28 ENCOUNTER — Emergency Department (HOSPITAL_COMMUNITY)

## 2024-06-28 ENCOUNTER — Inpatient Hospital Stay (HOSPITAL_COMMUNITY)
Admission: EM | Admit: 2024-06-28 | Discharge: 2024-07-01 | DRG: 922 | Disposition: A | Discharge status: Home / Self Care | Attending: Internal Medicine | Admitting: Internal Medicine

## 2024-06-28 DIAGNOSIS — R000 Tachycardia, unspecified: Secondary | ICD-10-CM | POA: Diagnosis present

## 2024-06-28 DIAGNOSIS — R319 Hematuria, unspecified: Secondary | ICD-10-CM | POA: Diagnosis present

## 2024-06-28 DIAGNOSIS — R0603 Acute respiratory distress: Secondary | ICD-10-CM | POA: Diagnosis present

## 2024-06-28 DIAGNOSIS — R748 Abnormal levels of other serum enzymes: Secondary | ICD-10-CM | POA: Diagnosis present

## 2024-06-28 DIAGNOSIS — X30XXXA Exposure to excessive natural heat, initial encounter: Secondary | ICD-10-CM | POA: Diagnosis not present

## 2024-06-28 DIAGNOSIS — Z1152 Encounter for screening for COVID-19: Secondary | ICD-10-CM

## 2024-06-28 DIAGNOSIS — Z79899 Other long term (current) drug therapy: Secondary | ICD-10-CM

## 2024-06-28 DIAGNOSIS — G9349 Other encephalopathy: Secondary | ICD-10-CM | POA: Diagnosis present

## 2024-06-28 DIAGNOSIS — D539 Nutritional anemia, unspecified: Secondary | ICD-10-CM | POA: Diagnosis present

## 2024-06-28 DIAGNOSIS — M6282 Rhabdomyolysis: Secondary | ICD-10-CM | POA: Diagnosis present

## 2024-06-28 DIAGNOSIS — Z87891 Personal history of nicotine dependence: Secondary | ICD-10-CM

## 2024-06-28 DIAGNOSIS — Z880 Allergy status to penicillin: Secondary | ICD-10-CM

## 2024-06-28 DIAGNOSIS — N179 Acute kidney failure, unspecified: Secondary | ICD-10-CM | POA: Diagnosis present

## 2024-06-28 DIAGNOSIS — E860 Dehydration: Secondary | ICD-10-CM | POA: Diagnosis present

## 2024-06-28 DIAGNOSIS — T675XXA Heat exhaustion, unspecified, initial encounter: Secondary | ICD-10-CM | POA: Diagnosis present

## 2024-06-28 DIAGNOSIS — E876 Hypokalemia: Secondary | ICD-10-CM | POA: Diagnosis present

## 2024-06-28 DIAGNOSIS — R55 Syncope and collapse: Secondary | ICD-10-CM | POA: Diagnosis not present

## 2024-06-28 DIAGNOSIS — G4700 Insomnia, unspecified: Secondary | ICD-10-CM | POA: Diagnosis present

## 2024-06-28 DIAGNOSIS — E872 Acidosis, unspecified: Secondary | ICD-10-CM | POA: Diagnosis present

## 2024-06-28 DIAGNOSIS — I10 Essential (primary) hypertension: Secondary | ICD-10-CM | POA: Diagnosis present

## 2024-06-28 DIAGNOSIS — K529 Noninfective gastroenteritis and colitis, unspecified: Secondary | ICD-10-CM | POA: Diagnosis present

## 2024-06-28 DIAGNOSIS — G9341 Metabolic encephalopathy: Secondary | ICD-10-CM | POA: Diagnosis present

## 2024-06-28 DIAGNOSIS — R569 Unspecified convulsions: Secondary | ICD-10-CM | POA: Diagnosis present

## 2024-06-28 DIAGNOSIS — T6701XA Heatstroke and sunstroke, initial encounter: Principal | ICD-10-CM | POA: Diagnosis present

## 2024-06-28 DIAGNOSIS — D7589 Other specified diseases of blood and blood-forming organs: Secondary | ICD-10-CM | POA: Diagnosis present

## 2024-06-28 LAB — CBC WITH DIFFERENTIAL/PLATELET
Abs Immature Granulocytes: 0.2 K/uL — ABNORMAL HIGH (ref 0.00–0.07)
Basophils Absolute: 0 K/uL (ref 0.0–0.1)
Basophils Relative: 0 %
Eosinophils Absolute: 0 K/uL (ref 0.0–0.5)
Eosinophils Relative: 0 %
HCT: 30 % — ABNORMAL LOW (ref 39.0–52.0)
Hemoglobin: 10.3 g/dL — ABNORMAL LOW (ref 13.0–17.0)
Immature Granulocytes: 2 %
Lymphocytes Relative: 3 %
Lymphs Abs: 0.3 K/uL — ABNORMAL LOW (ref 0.7–4.0)
MCH: 36.8 pg — ABNORMAL HIGH (ref 26.0–34.0)
MCHC: 34.3 g/dL (ref 30.0–36.0)
MCV: 107.1 fL — ABNORMAL HIGH (ref 80.0–100.0)
Monocytes Absolute: 0.8 K/uL (ref 0.1–1.0)
Monocytes Relative: 8 %
Neutro Abs: 8.3 K/uL — ABNORMAL HIGH (ref 1.7–7.7)
Neutrophils Relative %: 87 %
Platelets: 102 K/uL — ABNORMAL LOW (ref 150–400)
RBC: 2.8 MIL/uL — ABNORMAL LOW (ref 4.22–5.81)
RDW: 14.3 % (ref 11.5–15.5)
WBC: 9.6 K/uL (ref 4.0–10.5)
nRBC: 0.3 % — ABNORMAL HIGH (ref 0.0–0.2)

## 2024-06-28 LAB — URINALYSIS, W/ REFLEX TO CULTURE (INFECTION SUSPECTED)
Bilirubin Urine: NEGATIVE
Glucose, UA: NEGATIVE mg/dL
Ketones, ur: NEGATIVE mg/dL
Leukocytes,Ua: NEGATIVE
Nitrite: NEGATIVE
Protein, ur: 30 mg/dL — AB
Specific Gravity, Urine: 1.013 (ref 1.005–1.030)
pH: 5 (ref 5.0–8.0)

## 2024-06-28 LAB — I-STAT VENOUS BLOOD GAS, ED
Acid-base deficit: 9 mmol/L — ABNORMAL HIGH (ref 0.0–2.0)
Bicarbonate: 15.1 mmol/L — ABNORMAL LOW (ref 20.0–28.0)
Calcium, Ion: 1.15 mmol/L (ref 1.15–1.40)
HCT: 29 % — ABNORMAL LOW (ref 39.0–52.0)
Hemoglobin: 9.9 g/dL — ABNORMAL LOW (ref 13.0–17.0)
O2 Saturation: 100 %
Potassium: 3 mmol/L — ABNORMAL LOW (ref 3.5–5.1)
Sodium: 144 mmol/L (ref 135–145)
TCO2: 16 mmol/L — ABNORMAL LOW (ref 22–32)
pCO2, Ven: 27.2 mmHg — ABNORMAL LOW (ref 44–60)
pH, Ven: 7.354 (ref 7.25–7.43)
pO2, Ven: 198 mmHg — ABNORMAL HIGH (ref 32–45)

## 2024-06-28 LAB — COMPREHENSIVE METABOLIC PANEL WITH GFR
ALT: 15 U/L (ref 0–44)
AST: 26 U/L (ref 15–41)
Albumin: 3.4 g/dL — ABNORMAL LOW (ref 3.5–5.0)
Alkaline Phosphatase: 49 U/L (ref 38–126)
Anion gap: 6 (ref 5–15)
BUN: 23 mg/dL (ref 8–23)
CO2: 19 mmol/L — ABNORMAL LOW (ref 22–32)
Calcium: 8.4 mg/dL — ABNORMAL LOW (ref 8.9–10.3)
Chloride: 117 mmol/L — ABNORMAL HIGH (ref 98–111)
Creatinine, Ser: 2.31 mg/dL — ABNORMAL HIGH (ref 0.61–1.24)
GFR, Estimated: 30 mL/min — ABNORMAL LOW (ref >60)
Glucose, Bld: 109 mg/dL — ABNORMAL HIGH (ref 70–99)
Potassium: 2.9 mmol/L — ABNORMAL LOW (ref 3.5–5.1)
Sodium: 142 mmol/L (ref 135–145)
Total Bilirubin: 0.5 mg/dL (ref 0.0–1.2)
Total Protein: 5.9 g/dL — ABNORMAL LOW (ref 6.5–8.1)

## 2024-06-28 LAB — ETHANOL: Alcohol, Ethyl (B): <15 mg/dL (ref <15)

## 2024-06-28 LAB — RAPID URINE DRUG SCREEN, HOSP PERFORMED
Amphetamines: NOT DETECTED
Barbiturates: NOT DETECTED
Benzodiazepines: POSITIVE — AB
Cocaine: NOT DETECTED
Opiates: NOT DETECTED
Tetrahydrocannabinol: NOT DETECTED

## 2024-06-28 LAB — I-STAT CG4 LACTIC ACID, ED
Lactic Acid, Venous: 2.4 mmol/L (ref 0.5–1.9)
Lactic Acid, Venous: 2 mmol/L (ref 0.5–1.9)

## 2024-06-28 LAB — PROTIME-INR
INR: 1.1 (ref 0.8–1.2)
Prothrombin Time: 14.4 s (ref 11.4–15.2)

## 2024-06-28 LAB — RESP PANEL BY RT-PCR (RSV, FLU A&B, COVID)  RVPGX2
Influenza A by PCR: NEGATIVE
Influenza B by PCR: NEGATIVE
Resp Syncytial Virus by PCR: NEGATIVE
SARS Coronavirus 2 by RT PCR: NEGATIVE

## 2024-06-28 LAB — CK: Total CK: 278 U/L (ref 49–397)

## 2024-06-28 MED ORDER — VANCOMYCIN HCL IN DEXTROSE 1-5 GM/200ML-% IV SOLN
1000.0000 mg | Freq: Once | INTRAVENOUS | Status: AC
  Administered 2024-06-29: 1000 mg via INTRAVENOUS
  Filled 2024-06-28: qty 200

## 2024-06-28 MED ORDER — SODIUM CHLORIDE 0.9 % IV SOLN
2.0000 g | Freq: Once | INTRAVENOUS | Status: AC
  Administered 2024-06-28: 2 g via INTRAVENOUS
  Filled 2024-06-28: qty 12.5

## 2024-06-28 MED ORDER — LACTATED RINGERS IV SOLN: INTRAVENOUS | Status: AC

## 2024-06-28 MED ORDER — LACTATED RINGERS IV BOLUS (SEPSIS)
1000.0000 mL | Freq: Once | INTRAVENOUS | Status: AC
Start: 2024-06-28 — End: 2024-06-28
  Administered 2024-06-28: 1000 mL via INTRAVENOUS

## 2024-06-28 MED ORDER — LACTATED RINGERS IV BOLUS (SEPSIS)
500.0000 mL | Freq: Once | INTRAVENOUS | Status: AC
  Administered 2024-06-28: 500 mL via INTRAVENOUS

## 2024-06-28 MED ORDER — POTASSIUM CHLORIDE CRYS ER 20 MEQ PO TBCR
40.0000 meq | EXTENDED_RELEASE_TABLET | Freq: Once | ORAL | Status: AC
  Administered 2024-06-28: 40 meq via ORAL
  Filled 2024-06-28: qty 2

## 2024-06-28 MED ORDER — LACTATED RINGERS IV BOLUS (SEPSIS)
1000.0000 mL | Freq: Once | INTRAVENOUS | Status: AC
  Administered 2024-06-28: 1000 mL via INTRAVENOUS

## 2024-06-28 MED ORDER — METRONIDAZOLE 500 MG/100ML IV SOLN
500.0000 mg | Freq: Once | INTRAVENOUS | Status: AC
  Administered 2024-06-28: 500 mg via INTRAVENOUS
  Filled 2024-06-28: qty 100

## 2024-06-28 MED ORDER — DEXTROSE 5 % IV SOLN
10.0000 mg/kg | INTRAVENOUS | Status: DC
  Filled 2024-06-28: qty 15.2

## 2024-06-28 MED ORDER — IOHEXOL 350 MG/ML SOLN
75.0000 mL | Freq: Once | INTRAVENOUS | Status: AC | PRN
  Administered 2024-06-28: 75 mL via INTRAVENOUS

## 2024-06-28 NOTE — H&P (Signed)
 History and Physical    Cameron [name] FMW:[id number] DOB: Dec 25, 1955 DOA: 06/28/2024  PCP: [facility] [address]  Patient coming from: home  I have personally briefly reviewed patient's old medical records in [facility]  Chief Complaint: unresponsiveness  HPI: Cameron [name] is a 68 y.o. male with medical history significant of  Anxiety , HTN, Migraine , CLBP who presents to ED BIB EMS after he was found unresponsive in closed car in store parking lot.  Patient in the field was also noted to delirius and combative after becoming more alert.Per EMS patient had ? Seizure like activity and was given iv versed. Patient after treatment had decrease respiratory drive and was bagged briefly per notes. Patient states he walked for around 30-45 mins in the heat to the store to get his car ( as it has broken down  at the store earlier). He states was attempting to fix  the car. He states he walked  into the store and then back to his car . He states the last thing he remember was walking to his car.  He currently note complaint of diarrhea that started on arrival to ED,he notes no associated belly pain.  ED Course:  On presentation to the ED there was concern for sepsis and patient was placed on sepsis protocol and started on broad spectrum abx. Patient work up noted UA with blood but otherwise UA unremarkable. Patient was noted to have AKI with cr of 2.31 up from prior of 0.93. There was also concern for possible stroke base on patient presentation.Patient  MRI was noted to negative.CTA negative for LVO. Temp 102, bp 125/81, hr 114, rr 47  Wbc 9.6, hgb 10.3, mcv 107, plt 102,  Na 142, K 2.9, CL 117, cr 2.31 (0.93),  CK 278  UA: mod hgb, rare bacteria UDS + benzo EKG:  sinus tachycardia  Lactic 2.4 ,2.0 CTA : negative for LVO Vbg: 7.35/ 27.2  MRI IMPRESSION: No evidence of acute intracranial abnormality on motion limited assessment.   LR 1L , cefepime , metronidazole  , acyclovir  Review of  Systems: As per HPI otherwise 10 point review of systems negative.   Past Medical History:  Diagnosis Date   Anxiety    Back pain    Hard of hearing    Hypertension    Migraine    Seasonal allergies     Past Surgical History:  Procedure Laterality Date   FACIAL FRACTURE SURGERY     FOOT FRACTURE SURGERY     FRACTURE SURGERY  2008   L foot fx   KNEE SURGERY       reports that he has been smoking cigarettes. He has never used smokeless tobacco. He reports that he does not drink alcohol and does not use drugs.  Allergies  Allergen Reactions   Penicillins Shortness Of Breath and Swelling    Has patient had a PCN reaction causing immediate rash, facial/tongue/throat swelling, SOB or lightheadedness with hypotension: yes Has patient had a PCN reaction causing severe rash involving mucus membranes or skin necrosis: no Has patient had a PCN reaction that required hospitalization: yes,patient  was admitted   Did a PCN reaction occurring within the last 10 years: no If all of the above answers are NO, then may proceed with Cephalosporin use.     No family history on file.  Prior to Admission medications   Medication Sig Start Date End Date Taking? Authorizing Provider  amLODipine  (NORVASC ) 10 MG tablet Take 1 tablet (  10 mg total) by mouth daily. 06/26/22   Redwine, Madison A, PA-C  amoxicillin -clavulanate (AUGMENTIN ) 875-125 MG tablet Take 1 tablet by mouth every 12 (twelve) hours. 01/14/21   Neldon Hamp RAMAN, PA  cetirizine  (ZYRTEC ) 10 MG tablet Take 1 tablet (10 mg total) by mouth daily for 30 days. 04/03/19 05/03/19  Jason Fish B, PA-C  dexamethasone  (DECADRON ) 4 MG tablet Take 1 tablet (4 mg total) by mouth 2 (two) times daily. 08/05/20   Loetta Senior, MD  diphenhydramine -acetaminophen  (TYLENOL  PM) 25-500 MG TABS Take 1 tablet by mouth at bedtime as needed (pain/sleep).    [provider]  fluticasone  (FLONASE ) 50 MCG/ACT nasal spray Place 2 sprays into both nostrils  daily for 14 days. 01/14/21 01/28/21  Neldon Hamp RAMAN, PA  ibuprofen  (ADVIL ,MOTRIN ) 600 MG tablet Take 1 tablet (600 mg total) by mouth every 6 (six) hours as needed. 02/08/16   Ward, Ami Copes, PA-C  lidocaine  (LIDODERM ) 5 % Place 1 patch onto the skin daily. Remove & Discard patch within 12 hours or as directed by MD 10/01/19   Dreama Longs, MD  oxyCODONE -acetaminophen  (PERCOCET/ROXICET) 5-325 MG tablet Take 1 tablet by mouth every 6 (six) hours as needed for severe pain. 06/18/22   Phebe Fonda RAMAN, MD  tiZANidine  (ZANAFLEX ) 4 MG tablet Take 1 tablet (4 mg total) by mouth every 6 (six) hours as needed for muscle spasms. 06/26/22   Redwine, Lum LABOR, PA-C    Physical Exam: Vitals:   06/28/24 2015 06/28/24 2030 06/28/24 2145 06/28/24 2311  BP: (!) 141/95 (!) 142/92    Pulse: 86 83 82 83  Resp: (!) 27 (!) 22 (!) 21 (!) 28  Temp:      TempSrc:      SpO2: 100% 100% 100% 100%  Weight:        Constitutional: NAD, calm, comfortable Vitals:   06/28/24 2015 06/28/24 2030 06/28/24 2145 06/28/24 2311  BP: (!) 141/95 (!) 142/92    Pulse: 86 83 82 83  Resp: (!) 27 (!) 22 (!) 21 (!) 28  Temp:      TempSrc:      SpO2: 100% 100% 100% 100%  Weight:       Eyes: PERRL, lids and conjunctivae normal ENMT: Mucous membranes are moist. Posterior pharynx clear of any exudate or lesions.Normal dentition.  Neck: normal, supple, no masses, no thyromegaly Respiratory: clear to auscultation bilaterally, no wheezing, no crackles. Normal respiratory effort. No accessory muscle use.  Cardiovascular: Regular rate and rhythm, no murmurs / rubs / gallops. No extremity edema. 2+ pedal pulses. No carotid bruits.  Abdomen: no tenderness, no masses palpated. No hepatosplenomegaly. Bowel sounds positive.  Musculoskeletal: no clubbing / cyanosis. No joint deformity upper and lower extremities. Good ROM, no contractures. Normal muscle tone.  Skin: no rashes, lesions, ulcers. No induration Neurologic: CN 2-12 grossly  intact. Sensation intact, DTR normal. Strength 5/5 in all 4.  Psychiatric: Normal judgment and insight. Alert and oriented x 3. Normal mood.    Labs on Admission: I have personally reviewed following labs and imaging studies  CBC: Recent Labs  Lab 06/28/24 1705 06/28/24 1734  WBC 9.6  --   NEUTROABS 8.3*  --   HGB 10.3* 9.9*  HCT 30.0* 29.0*  MCV 107.1*  --   PLT 102*  --    Basic Metabolic Panel: Recent Labs  Lab 06/28/24 1705 06/28/24 1734  NA 142 144  K 2.9* 3.0*  CL 117*  --   CO2 19*  --  GLUCOSE 109*  --   BUN 23  --   CREATININE 2.31*  --   CALCIUM 8.4*  --    GFR: CrCl cannot be calculated (Unknown ideal weight.). Liver Function Tests: Recent Labs  Lab 06/28/24 1705  AST 26  ALT 15  ALKPHOS 49  BILITOT 0.5  PROT 5.9*  ALBUMIN 3.4*   No results for input(s): LIPASE, AMYLASE in the last 168 hours. No results for input(s): AMMONIA in the last 168 hours. Coagulation Profile: Recent Labs  Lab 06/28/24 1705  INR 1.1   Cardiac Enzymes: Recent Labs  Lab 06/28/24 1705  CKTOTAL 278   BNP (last 3 results) No results for input(s): PROBNP in the last 8760 hours. HbA1C: No results for input(s): HGBA1C in the last 72 hours. CBG: No results for input(s): GLUCAP in the last 168 hours. Lipid Profile: No results for input(s): CHOL, HDL, LDLCALC, TRIG, CHOLHDL, LDLDIRECT in the last 72 hours. Thyroid Function Tests: No results for input(s): TSH, T4TOTAL, FREET4, T3FREE, THYROIDAB in the last 72 hours. Anemia Panel: No results for input(s): VITAMINB12, FOLATE, FERRITIN, TIBC, IRON, RETICCTPCT in the last 72 hours. Urine analysis:    Component Value Date/Time   COLORURINE YELLOW 06/28/2024 1705   APPEARANCEUR HAZY (A) 06/28/2024 1705   LABSPEC 1.013 06/28/2024 1705   PHURINE 5.0 06/28/2024 1705   GLUCOSEU NEGATIVE 06/28/2024 1705   HGBUR MODERATE (A) 06/28/2024 1705   BILIRUBINUR NEGATIVE 06/28/2024 1705    KETONESUR NEGATIVE 06/28/2024 1705   PROTEINUR 30 (A) 06/28/2024 1705   UROBILINOGEN 0.2 08/07/2014 2225   NITRITE NEGATIVE 06/28/2024 1705   LEUKOCYTESUR NEGATIVE 06/28/2024 1705    Radiological Exams on Admission: MR BRAIN WO CONTRAST Result Date: 06/28/2024 CLINICAL DATA:  Mental status change, unknown cause EXAM: MRI HEAD WITHOUT CONTRAST TECHNIQUE: Multiplanar, multiecho pulse sequences of the brain and surrounding structures were obtained without intravenous contrast. COMPARISON:  CT head from earlier today. FINDINGS: Motion limited incomplete exam. SWI imaging not performed. Within that limitation: Brain: No acute infarction, hemorrhage, hydrocephalus, extra-axial collection or mass lesion. Mild scattered T2/FLAIR hyperintensities within the white matter, nonspecific but compatible with chronic microvascular ischemic disease. Vascular: Major arterial flow voids are maintained at the skull base. Skull and upper cervical spine: Normal marrow signal. Sinuses/Orbits: Clear sinuses.  No acute orbital findings. Other: No mastoid effusions IMPRESSION: No evidence of acute intracranial abnormality on motion limited assessment. Electronically Signed   By: Gilmore GORMAN Molt M.D.   On: 06/28/2024 22:20   CT ANGIO HEAD NECK W WO CM Result Date: 06/28/2024 EXAM: CTA HEAD AND NECK WITH AND WITHOUT 06/28/2024 06:50:39 PM TECHNIQUE: CTA of the head and neck was performed with and without the administration of intravenous contrast. Multiplanar 2D and/or 3D reformatted images are provided for review. Automated exposure control, iterative reconstruction, and/or weight based adjustment of the mA/kV was utilized to reduce the radiation dose to as low as reasonably achievable. Stenosis of the internal carotid arteries measured using NASCET criteria. COMPARISON: Head CT 06/28/2024 CLINICAL HISTORY: Neuro deficit, acute, stroke suspected. FINDINGS: CTA NECK: AORTIC ARCH AND ARCH VESSELS: Atherosclerotic calcifications  of the aortic arch and arch vessel origins, which remain patent. No dissection or arterial injury. No significant stenosis of the brachiocephalic or subclavian arteries. CERVICAL CAROTID ARTERIES: Mild calcified plaque along the bilateral carotid bulbs and proximal cervical ICAs without hemodynamically significant stenosis. No dissection or arterial injury. 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 No  dissection or arterial injury. LUNGS AND MEDIASTINUM: Unremarkable. SOFT TISSUES: No acute abnormality. BONES: No acute abnormality. CTA HEAD: ANTERIOR CIRCULATION: No significant stenosis of the internal carotid arteries. No significant stenosis of the anterior cerebral arteries. No significant stenosis of the middle cerebral arteries. No aneurysm. POSTERIOR CIRCULATION: Atherosclerotic calcifications of the ICA siphons without significant stenosis or aneurysm. No significant stenosis of the posterior cerebral arteries. No significant stenosis of the basilar artery. No aneurysm. OTHER: No dural venous sinus thrombosis on this non-dedicated study. IMPRESSION: 1. No large vessel occlusion in the head or neck vessels. 2. 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 Code stroke results were communicated to Dr. Lindzen at 7:03pm on 06/28/2024 by secure text paging. Electronically signed by: Ryan Chess MD 06/28/2024 07:05 PM EDT RP Workstation: [id number]   CT Head Wo Contrast Result Date: 06/28/2024 CLINICAL DATA:  Headache, increasing frequency or severity EXAM: CT HEAD WITHOUT CONTRAST TECHNIQUE: Contiguous axial images were obtained from the base of the skull through the vertex without intravenous contrast. RADIATION DOSE REDUCTION: This exam was performed according to the departmental dose-optimization program which includes automated exposure control, adjustment of the mA and/or kV according to  patient size and/or use of iterative reconstruction technique. COMPARISON:  CT head 11/17/2021 FINDINGS: Brain: Vague hypoattenuation in the right temporal lobe compared to the left (circa series 2/image 10). No intracranial hemorrhage or mass effect. No hydrocephalus. No extra-axial fluid collection. Mild cerebral atrophy. Age advanced chronic small vessel ischemic disease. Vascular: No hyperdense vessel. Intracranial arterial calcification. Skull: No fracture or focal lesion. Sinuses/Orbits: No acute finding. Other: None. IMPRESSION: Vague hypoattenuation in the right temporal lobe compared to the left. This is favored artifactual however developing infarct or HSV encephalitis could have this appearance. MRI is recommended. Critical Value/emergent results were called by telephone at the time of interpretation on 06/28/2024 at 5:42 pm to provider ANDREW TEE , who verbally acknowledged these results. Electronically Signed   By: Norman Gatlin M.D.   On: 06/28/2024 17:53   DG Chest Port 1 View Result Date: 06/28/2024 CLINICAL DATA:  68 year old male with possible sepsis. EXAM: PORTABLE CHEST 1 VIEW COMPARISON:  Chest radiographs 11/17/2021 and earlier. FINDINGS: Portable AP semi upright view at 1721 hours. Considerably lower lung volumes, accentuated cardiac and mediastinal contours. Visualized tracheal air column is within normal limits. Allowing for portable technique the lungs are clear. No pneumothorax or pleural effusion. Negative visible bowel gas. No acute osseous abnormality identified. IMPRESSION: Low lung volumes, otherwise no acute cardiopulmonary abnormality. Electronically Signed   By: VEAR Hurst M.D.   On: 06/28/2024 17:29    EKG: Independently reviewed.   Assessment/Plan  Acute encephalopathy /delirium r/o TIA - appears to have resolved patient s/p tx with ivfs  back to baseline ms -presumed due to heat exhaustion vs post ictal state  -CTH/MRI negative  -neuro exam currently  non-focal -infectious work on going/f/u cultures -to be complete EEG ordered   AKI -admit to med tele -place on ivfs  - avoid nephrotoxic medication   Heat exhaustion  -s/p  extended period out doors in hot weather  -patient with elevated temperature to 102 -temperature improved back to baseline after ivfs and ice packs   Lactic acidosis  - in setting of AKI and dehydration -based on presentation concern for infection  - no infectious source found - f/u on blood culture   ? Seizure activity  - s/p versed int he field  -no  further episode  -place on seizure precautions  - EEG    Acute gastroenteritis  -no abdominal pain  - noted + diarrhea  - stool studies pending   HTN -resume amlodipine  as able     DVT prophylaxis: heparin Code Status: full/ as discussed per patient wishes in event of cardiac arrest  Family Communication: none at bedside Disposition Plan:patient  expected to be admitted greater than 2 midnights  Consults called: n/a Admission status: progressive care    Camila DELENA Ned MD [facility]   If 7PM-7AM, please contact night-coverage [email or web address] Password [facility]  06/28/2024, 11:41 PM

## 2024-06-28 NOTE — ED Notes (Signed)
 Only got 1 set of blood culture before started abx. MD made aware.

## 2024-06-28 NOTE — ED Provider Notes (Signed)
 [place] [facility] DE[facility]MENT AT [facility] Provider Note   CSN: [id number] Arrival date & time: 06/28/24  1657     Patient presents with: Heat Exposure   Cameron [name] is a 68 y.o. male.   68 year old male with past medical history of hypertension and migraine headaches presenting to the emergency department today with some respiratory distress.  The patient apparently went to the grocery store and was normal 30 minutes prior to arrival.  He was found in his car with the car off of the windows up and was minimally responsive when his family went to check on him.  The patient was apparently confused.  Medics noted that the patient did have some shaking activity in his right side and he was given Versed.  After the Versed they had to bag the patient briefly.  He was brought in at that time for further evaluation.        Prior to Admission medications   Medication Sig Start Date End Date Taking? Authorizing Provider  amLODipine  (NORVASC ) 10 MG tablet Take 1 tablet (10 mg total) by mouth daily. 06/26/22   Redwine, Madison A, PA-C  amoxicillin -clavulanate (AUGMENTIN ) 875-125 MG tablet Take 1 tablet by mouth every 12 (twelve) hours. 01/14/21   Neldon Hamp RAMAN, PA  cetirizine  (ZYRTEC ) 10 MG tablet Take 1 tablet (10 mg total) by mouth daily for 30 days. 04/03/19 05/03/19  Jason Fish B, PA-C  dexamethasone  (DECADRON ) 4 MG tablet Take 1 tablet (4 mg total) by mouth 2 (two) times daily. 08/05/20   Loetta Senior, MD  diphenhydramine -acetaminophen  (TYLENOL  PM) 25-500 MG TABS Take 1 tablet by mouth at bedtime as needed (pain/sleep).    [provider]  fluticasone  (FLONASE ) 50 MCG/ACT nasal spray Place 2 sprays into both nostrils daily for 14 days. 01/14/21 01/28/21  Neldon Hamp RAMAN, PA  ibuprofen  (ADVIL ,MOTRIN ) 600 MG tablet Take 1 tablet (600 mg total) by mouth every 6 (six) hours as needed. 02/08/16   Ward, Ami Copes, PA-C  lidocaine  (LIDODERM ) 5 % Place 1 patch onto the  skin daily. Remove & Discard patch within 12 hours or as directed by MD 10/01/19   Dreama Longs, MD  oxyCODONE -acetaminophen  (PERCOCET/ROXICET) 5-325 MG tablet Take 1 tablet by mouth every 6 (six) hours as needed for severe pain. 06/18/22   Phebe Fonda RAMAN, MD  tiZANidine  (ZANAFLEX ) 4 MG tablet Take 1 tablet (4 mg total) by mouth every 6 (six) hours as needed for muscle spasms. 06/26/22   Redwine, Madison A, PA-C    Allergies: Penicillins    Review of Systems  Reason unable to perform ROS: Current mental status.  All other systems reviewed and are negative.   Updated Vital Signs BP (!) 142/92   Pulse 83   Temp (!) 95.4 F (35.2 C) (Rectal)   Resp (!) 28   Wt 75.8 kg   SpO2 100%   BMI 22.05 kg/m   Physical Exam Vitals and nursing note reviewed.   Gen: Eyes open spontaneously, not following commands Eyes: PERRL, EOMI HEENT: no oropharyngeal swelling Neck: trachea midline Resp: Diminished at bilateral lung bases Card: RRR, no murmurs, rubs, or gallops Abd: nontender, nondistended Extremities: no calf tenderness, no edema Vascular: 2+ radial pulses bilaterally, 2+ DP pulses bilaterally Neuro: Will localize to painful stimuli, and able to understand some of the words that the patient is saying intermittently but speech is nonsensical, will move all extremities with painful stimuli Skin: Warm to the touch Psyc: acting appropriately   (  all labs ordered are listed, but only abnormal results are displayed) Labs Reviewed  COMPREHENSIVE METABOLIC PANEL WITH GFR - Abnormal; Notable for the following components:      Result Value   Potassium 2.9 (*)    Chloride 117 (*)    CO2 19 (*)    Glucose, Bld 109 (*)    Creatinine, Ser 2.31 (*)    Calcium 8.4 (*)    Total Protein 5.9 (*)    Albumin 3.4 (*)    GFR, Estimated 30 (*)    All other components within normal limits  CBC WITH DIFFERENTIAL/PLATELET - Abnormal; Notable for the following components:   RBC 2.80 (*)    Hemoglobin  10.3 (*)    HCT 30.0 (*)    MCV 107.1 (*)    MCH 36.8 (*)    Platelets 102 (*)    nRBC 0.3 (*)    Neutro Abs 8.3 (*)    Lymphs Abs 0.3 (*)    Abs Immature Granulocytes 0.20 (*)    All other components within normal limits  URINALYSIS, W/ REFLEX TO CULTURE (INFECTION SUSPECTED) - Abnormal; Notable for the following components:   APPearance HAZY (*)    Hgb urine dipstick MODERATE (*)    Protein, ur 30 (*)    Bacteria, UA RARE (*)    All other components within normal limits  RAPID URINE DRUG SCREEN, HOSP PERFORMED - Abnormal; Notable for the following components:   Benzodiazepines POSITIVE (*)    All other components within normal limits  I-STAT CG4 LACTIC ACID, ED - Abnormal; Notable for the following components:   Lactic Acid, Venous 2.4 (*)    All other components within normal limits  I-STAT VENOUS BLOOD GAS, ED - Abnormal; Notable for the following components:   pCO2, Ven 27.2 (*)    pO2, Ven 198 (*)    Bicarbonate 15.1 (*)    TCO2 16 (*)    Acid-base deficit 9.0 (*)    Potassium 3.0 (*)    HCT 29.0 (*)    Hemoglobin 9.9 (*)    All other components within normal limits  I-STAT CG4 LACTIC ACID, ED - Abnormal; Notable for the following components:   Lactic Acid, Venous 2.0 (*)    All other components within normal limits  RESP PANEL BY RT-PCR (RSV, FLU A&B, COVID)  RVPGX2  CULTURE, BLOOD (ROUTINE X 2)  CULTURE, BLOOD (ROUTINE X 2)  PROTIME-INR  CK  ETHANOL  BLOOD GAS, VENOUS    EKG: EKG Interpretation Date/Time:  Sunday June 28 2024 17:13:10 EDT Ventricular Rate:  109 PR Interval:  160 QRS Duration:  105 QT Interval:  366 QTC Calculation: 493 R Axis:   47  Text Interpretation: Sinus tachycardia Borderline repol abnormality, diffuse leads Borderline prolonged QT interval When compared with ECG of 11/17/2021, HEART RATE has increased Nonspecific T wave abnormality is now present Confirmed by Raford Lenis ([id number]) on 06/28/2024 11:11:37 PM  Radiology: MR BRAIN WO  CONTRAST Result Date: 06/28/2024 CLINICAL DATA:  Mental status change, unknown cause EXAM: MRI HEAD WITHOUT CONTRAST TECHNIQUE: Multiplanar, multiecho pulse sequences of the brain and surrounding structures were obtained without intravenous contrast. COMPARISON:  CT head from earlier today. FINDINGS: Motion limited incomplete exam. SWI imaging not performed. Within that limitation: Brain: No acute infarction, hemorrhage, hydrocephalus, extra-axial collection or mass lesion. Mild scattered T2/FLAIR hyperintensities within the white matter, nonspecific but compatible with chronic microvascular ischemic disease. Vascular: Major arterial flow voids are maintained at the skull base. Skull  and upper cervical spine: Normal marrow signal. Sinuses/Orbits: Clear sinuses.  No acute orbital findings. Other: No mastoid effusions IMPRESSION: No evidence of acute intracranial abnormality on motion limited assessment. Electronically Signed   By: Gilmore GORMAN Molt M.D.   On: 06/28/2024 22:20   CT ANGIO HEAD NECK W WO CM Result Date: 06/28/2024 EXAM: CTA HEAD AND NECK WITH AND WITHOUT 06/28/2024 06:50:39 PM TECHNIQUE: CTA of the head and neck was performed with and without the administration of intravenous contrast. Multiplanar 2D and/or 3D reformatted images are provided for review. Automated exposure control, iterative reconstruction, and/or weight based adjustment of the mA/kV was utilized to reduce the radiation dose to as low as reasonably achievable. Stenosis of the internal carotid arteries measured using NASCET criteria. COMPARISON: Head CT 06/28/2024 CLINICAL HISTORY: Neuro deficit, acute, stroke suspected. FINDINGS: CTA NECK: AORTIC ARCH AND ARCH VESSELS: Atherosclerotic calcifications of the aortic arch and arch vessel origins, which remain patent. No dissection or arterial injury. No significant stenosis of the brachiocephalic or subclavian arteries. CERVICAL CAROTID ARTERIES: Mild calcified plaque along the bilateral  carotid bulbs and proximal cervical ICAs without hemodynamically significant stenosis. No dissection or arterial injury. 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 No dissection or arterial injury. LUNGS AND MEDIASTINUM: Unremarkable. SOFT TISSUES: No acute abnormality. BONES: No acute abnormality. CTA HEAD: ANTERIOR CIRCULATION: No significant stenosis of the internal carotid arteries. No significant stenosis of the anterior cerebral arteries. No significant stenosis of the middle cerebral arteries. No aneurysm. POSTERIOR CIRCULATION: Atherosclerotic calcifications of the ICA siphons without significant stenosis or aneurysm. No significant stenosis of the posterior cerebral arteries. No significant stenosis of the basilar artery. No aneurysm. OTHER: No dural venous sinus thrombosis on this non-dedicated study. IMPRESSION: 1. No large vessel occlusion in the head or neck vessels. 2. 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 Code stroke results were communicated to Dr. Lindzen at 7:03pm on 06/28/2024 by secure text paging. Electronically signed by: Ryan Chess MD 06/28/2024 07:05 PM EDT RP Workstation: [id number]   CT Head Wo Contrast Result Date: 06/28/2024 CLINICAL DATA:  Headache, increasing frequency or severity EXAM: CT HEAD WITHOUT CONTRAST TECHNIQUE: Contiguous axial images were obtained from the base of the skull through the vertex without intravenous contrast. RADIATION DOSE REDUCTION: This exam was performed according to the departmental dose-optimization program which includes automated exposure control, adjustment of the mA and/or kV according to patient size and/or use of iterative reconstruction technique. COMPARISON:  CT head 11/17/2021 FINDINGS: Brain: Vague hypoattenuation in the right temporal lobe compared to the left (circa series 2/image 10). No intracranial hemorrhage or mass  effect. No hydrocephalus. No extra-axial fluid collection. Mild cerebral atrophy. Age advanced chronic small vessel ischemic disease. Vascular: No hyperdense vessel. Intracranial arterial calcification. Skull: No fracture or focal lesion. Sinuses/Orbits: No acute finding. Other: None. IMPRESSION: Vague hypoattenuation in the right temporal lobe compared to the left. This is favored artifactual however developing infarct or HSV encephalitis could have this appearance. MRI is recommended. Critical Value/emergent results were called by telephone at the time of interpretation on 06/28/2024 at 5:42 pm to provider Derryck Shahan , who verbally acknowledged these results. Electronically Signed   By: Norman Gatlin M.D.   On: 06/28/2024 17:53   DG Chest Port 1 View Result Date: 06/28/2024 CLINICAL DATA:  68 year old male with possible sepsis. EXAM: PORTABLE CHEST 1 VIEW COMPARISON:  Chest radiographs 11/17/2021 and  earlier. FINDINGS: Portable AP semi upright view at 1721 hours. Considerably lower lung volumes, accentuated cardiac and mediastinal contours. Visualized tracheal air column is within normal limits. Allowing for portable technique the lungs are clear. No pneumothorax or pleural effusion. Negative visible bowel gas. No acute osseous abnormality identified. IMPRESSION: Low lung volumes, otherwise no acute cardiopulmonary abnormality. Electronically Signed   By: VEAR Hurst M.D.   On: 06/28/2024 17:29     Procedures   Medications Ordered in the ED  lactated ringers  infusion ( Intravenous New Bag/Given 06/28/24 1752)  vancomycin  (VANCOCIN ) IVPB 1000 mg/200 mL premix (has no administration in time range)  ceFEPIme  (MAXIPIME ) 2 g in sodium chloride  0.9 % 100 mL IVPB (0 g Intravenous Stopped 06/28/24 1857)  metroNIDAZOLE  (FLAGYL ) IVPB 500 mg (0 mg Intravenous Stopped 06/28/24 2148)  lactated ringers  bolus 1,000 mL (0 mLs Intravenous Stopped 06/28/24 1956)    And  lactated ringers  bolus 1,000 mL (0 mLs Intravenous  Stopped 06/28/24 1955)    And  lactated ringers  bolus 500 mL (0 mLs Intravenous Stopped 06/28/24 1857)  iohexol  (OMNIPAQUE ) 350 MG/ML injection 75 mL (75 mLs Intravenous Contrast Given 06/28/24 1850)  potassium chloride  SA (KLOR-CON  M) CR tablet 40 mEq (40 mEq Oral Given 06/28/24 2034)                                    Medical Decision Making 68 year old male with past medical history of hypertension presenting to the emergency department today with altered mental status.  The patient is currently protecting his airway.  He was apparently in his normal state of health prior to going to the grocery store.  Unclear if he had a syncopal episode but was initially unresponsive and had questionable shaking activity.  I will further evaluate patient here with a sepsis workup although this may be due to heat exhaustion.  Will give the patient IV fluids here.  I did get a call from radiology that the patient had some abnormal findings on his CT and MRI is recommended.  I did try to speak with the patient about the timing of his symptom onset but he is unable to give me a clear time.  Also tried calling the numbers in the patient's chart but was unable to get in touch with anyone to determine last known normal.  There were concerns for possible stroke.  The patient may have some very mild right lower extremity weakness compared to the left but his neuroexam is not consistent with large vessel occlusion at this time.  The patient's official CT read as some possible concerns for temporal lobe changes consistent with stroke or HSV encephalitis.  The patient was given a dose of acyclovir  while MRI was pending.  Before this the patient is back to his baseline.  He does not have any confusion or meningismus here.  MRI did come back negative.  The patient does have significant AKI.  A call is placed for admission for IV fluids for this.  Clinical suspicion for meningitis or encephalitis is low at this time as the  patient's temperature improved without any antipyretics here.  He again is back to his baseline mentally and can tell me now specifically that he was walking and became overheated and then went to sit down and his car because he did not think that he could make it to the door.  He was in his normal state of  health prior to this.  He was then found unresponsive so again suspicion for infectious etiology is low at this time.  Calls placed for admission.  CRITICAL CARE Performed by: Prentice JONELLE Medicus   Total critical care time: 35 minutes  Critical care time was exclusive of separately billable procedures and treating other patients.  Critical care was necessary to treat or prevent imminent or life-threatening deterioration.  Critical care was time spent personally by me on the following activities: development of treatment plan with patient and/or surrogate as well as nursing, discussions with consultants, evaluation of patient's response to treatment, examination of patient, obtaining history from patient or surrogate, ordering and performing treatments and interventions, ordering and review of laboratory studies, ordering and review of radiographic studies, pulse oximetry and re-evaluation of patient's condition.   Amount and/or Complexity of Data Reviewed Labs: ordered. Radiology: ordered.  Risk Prescription drug management. Decision regarding hospitalization.        Final diagnoses:  AKI (acute kidney injury) (HCC)  Heat stroke, initial encounter    ED Discharge Orders     None          [facility] Prentice JONELLE, MD 06/28/24 2359

## 2024-06-28 NOTE — ED Notes (Signed)
 Patient transported to MRI

## 2024-06-28 NOTE — H&P (Incomplete)
 History and Physical    MARCELINO CAMPOS FMW:[id number] DOB: 1956/10/04 DOA: 06/28/2024  PCP: [facility] [address]  Patient coming from: ***  I have personally briefly reviewed patient's old medical records in [facility]  Chief Complaint: ***  HPI: MARKEIS ALLMAN is a 68 y.o. male with medical history significant of    ED Course: ***  Review of Systems: As per HPI otherwise 10 point review of systems negative.   Past Medical History:  Diagnosis Date  . Anxiety   . Back pain   . Hard of hearing   . Hypertension   . Migraine   . Seasonal allergies     Past Surgical History:  Procedure Laterality Date  . FACIAL FRACTURE SURGERY    . FOOT FRACTURE SURGERY    . FRACTURE SURGERY  2008   L foot fx  . KNEE SURGERY       reports that he has been smoking cigarettes. He has never used smokeless tobacco. He reports that he does not drink alcohol and does not use drugs.  Allergies  Allergen Reactions  . Penicillins Shortness Of Breath and Swelling    Has patient had a PCN reaction causing immediate rash, facial/tongue/throat swelling, SOB or lightheadedness with hypotension: yes Has patient had a PCN reaction causing severe rash involving mucus membranes or skin necrosis: no Has patient had a PCN reaction that required hospitalization: yes,patient  was admitted   Did a PCN reaction occurring within the last 10 years: no If all of the above answers are NO, then may proceed with Cephalosporin use.     No family history on file. *** Prior to Admission medications   Medication Sig Start Date End Date Taking? Authorizing Provider  amLODipine  (NORVASC ) 10 MG tablet Take 1 tablet (10 mg total) by mouth daily. 06/26/22   Redwine, Madison A, PA-C  amoxicillin -clavulanate (AUGMENTIN ) 875-125 MG tablet Take 1 tablet by mouth every 12 (twelve) hours. 01/14/21   Neldon Hamp RAMAN, PA  cetirizine  (ZYRTEC ) 10 MG tablet Take 1 tablet (10 mg total) by mouth daily for 30 days. 04/03/19 05/03/19   Murray, Alyssa B, PA-C  dexamethasone  (DECADRON ) 4 MG tablet Take 1 tablet (4 mg total) by mouth 2 (two) times daily. 08/05/20   Loetta Senior, MD  diphenhydramine -acetaminophen  (TYLENOL  PM) 25-500 MG TABS Take 1 tablet by mouth at bedtime as needed (pain/sleep).    [provider]  fluticasone  (FLONASE ) 50 MCG/ACT nasal spray Place 2 sprays into both nostrils daily for 14 days. 01/14/21 01/28/21  Neldon Hamp RAMAN, PA  ibuprofen  (ADVIL ,MOTRIN ) 600 MG tablet Take 1 tablet (600 mg total) by mouth every 6 (six) hours as needed. 02/08/16   Ward, Ami Copes, PA-C  lidocaine  (LIDODERM ) 5 % Place 1 patch onto the skin daily. Remove & Discard patch within 12 hours or as directed by MD 10/01/19   Dreama Longs, MD  oxyCODONE -acetaminophen  (PERCOCET/ROXICET) 5-325 MG tablet Take 1 tablet by mouth every 6 (six) hours as needed for severe pain. 06/18/22   Phebe Fonda RAMAN, MD  tiZANidine  (ZANAFLEX ) 4 MG tablet Take 1 tablet (4 mg total) by mouth every 6 (six) hours as needed for muscle spasms. 06/26/22   RedwineLum LABOR, PA-C    Physical Exam: Vitals:   06/28/24 2015 06/28/24 2030 06/28/24 2145 06/28/24 2311  BP: (!) 141/95 (!) 142/92    Pulse: 86 83 82 83  Resp: (!) 27 (!) 22 (!) 21 (!) 28  Temp:  TempSrc:      SpO2: 100% 100% 100% 100%  Weight:        Constitutional: NAD, calm, comfortable Vitals:   06/28/24 2015 06/28/24 2030 06/28/24 2145 06/28/24 2311  BP: (!) 141/95 (!) 142/92    Pulse: 86 83 82 83  Resp: (!) 27 (!) 22 (!) 21 (!) 28  Temp:      TempSrc:      SpO2: 100% 100% 100% 100%  Weight:       Eyes: PERRL, lids and conjunctivae normal ENMT: Mucous membranes are moist. Posterior pharynx clear of any exudate or lesions.Normal dentition.  Neck: normal, supple, no masses, no thyromegaly Respiratory: clear to auscultation bilaterally, no wheezing, no crackles. Normal respiratory effort. No accessory muscle use.  Cardiovascular: Regular rate and rhythm, no murmurs / rubs /  gallops. No extremity edema. 2+ pedal pulses. No carotid bruits.  Abdomen: no tenderness, no masses palpated. No hepatosplenomegaly. Bowel sounds positive.  Musculoskeletal: no clubbing / cyanosis. No joint deformity upper and lower extremities. Good ROM, no contractures. Normal muscle tone.  Skin: no rashes, lesions, ulcers. No induration Neurologic: CN 2-12 grossly intact. Sensation intact, DTR normal. Strength 5/5 in all 4.  Psychiatric: Normal judgment and insight. Alert and oriented x 3. Normal mood.    Labs on Admission: I have personally reviewed following labs and imaging studies  CBC: Recent Labs  Lab 06/28/24 1705 06/28/24 1734  WBC 9.6  --   NEUTROABS 8.3*  --   HGB 10.3* 9.9*  HCT 30.0* 29.0*  MCV 107.1*  --   PLT 102*  --    Basic Metabolic Panel: Recent Labs  Lab 06/28/24 1705 06/28/24 1734  NA 142 144  K 2.9* 3.0*  CL 117*  --   CO2 19*  --   GLUCOSE 109*  --   BUN 23  --   CREATININE 2.31*  --   CALCIUM 8.4*  --    GFR: CrCl cannot be calculated (Unknown ideal weight.). Liver Function Tests: Recent Labs  Lab 06/28/24 1705  AST 26  ALT 15  ALKPHOS 49  BILITOT 0.5  PROT 5.9*  ALBUMIN 3.4*   No results for input(s): LIPASE, AMYLASE in the last 168 hours. No results for input(s): AMMONIA in the last 168 hours. Coagulation Profile: Recent Labs  Lab 06/28/24 1705  INR 1.1   Cardiac Enzymes: Recent Labs  Lab 06/28/24 1705  CKTOTAL 278   BNP (last 3 results) No results for input(s): PROBNP in the last 8760 hours. HbA1C: No results for input(s): HGBA1C in the last 72 hours. CBG: No results for input(s): GLUCAP in the last 168 hours. Lipid Profile: No results for input(s): CHOL, HDL, LDLCALC, TRIG, CHOLHDL, LDLDIRECT in the last 72 hours. Thyroid Function Tests: No results for input(s): TSH, T4TOTAL, FREET4, T3FREE, THYROIDAB in the last 72 hours. Anemia Panel: No results for input(s): VITAMINB12,  FOLATE, FERRITIN, TIBC, IRON, RETICCTPCT in the last 72 hours. Urine analysis:    Component Value Date/Time   COLORURINE YELLOW 06/28/2024 1705   APPEARANCEUR HAZY (A) 06/28/2024 1705   LABSPEC 1.013 06/28/2024 1705   PHURINE 5.0 06/28/2024 1705   GLUCOSEU NEGATIVE 06/28/2024 1705   HGBUR MODERATE (A) 06/28/2024 1705   BILIRUBINUR NEGATIVE 06/28/2024 1705   KETONESUR NEGATIVE 06/28/2024 1705   PROTEINUR 30 (A) 06/28/2024 1705   UROBILINOGEN 0.2 08/07/2014 2225   NITRITE NEGATIVE 06/28/2024 1705   LEUKOCYTESUR NEGATIVE 06/28/2024 1705    Radiological Exams on Admission: MR BRAIN WO CONTRAST  Result Date: 06/28/2024 CLINICAL DATA:  Mental status change, unknown cause EXAM: MRI HEAD WITHOUT CONTRAST TECHNIQUE: Multiplanar, multiecho pulse sequences of the brain and surrounding structures were obtained without intravenous contrast. COMPARISON:  CT head from earlier today. FINDINGS: Motion limited incomplete exam. SWI imaging not performed. Within that limitation: Brain: No acute infarction, hemorrhage, hydrocephalus, extra-axial collection or mass lesion. Mild scattered T2/FLAIR hyperintensities within the white matter, nonspecific but compatible with chronic microvascular ischemic disease. Vascular: Major arterial flow voids are maintained at the skull base. Skull and upper cervical spine: Normal marrow signal. Sinuses/Orbits: Clear sinuses.  No acute orbital findings. Other: No mastoid effusions IMPRESSION: No evidence of acute intracranial abnormality on motion limited assessment. Electronically Signed   By: Gilmore GORMAN Molt M.D.   On: 06/28/2024 22:20   CT ANGIO HEAD NECK W WO CM Result Date: 06/28/2024 EXAM: CTA HEAD AND NECK WITH AND WITHOUT 06/28/2024 06:50:39 PM TECHNIQUE: CTA of the head and neck was performed with and without the administration of intravenous contrast. Multiplanar 2D and/or 3D reformatted images are provided for review. Automated exposure control, iterative  reconstruction, and/or weight based adjustment of the mA/kV was utilized to reduce the radiation dose to as low as reasonably achievable. Stenosis of the internal carotid arteries measured using NASCET criteria. COMPARISON: Head CT 06/28/2024 CLINICAL HISTORY: Neuro deficit, acute, stroke suspected. FINDINGS: CTA NECK: AORTIC ARCH AND ARCH VESSELS: Atherosclerotic calcifications of the aortic arch and arch vessel origins, which remain patent. No dissection or arterial injury. No significant stenosis of the brachiocephalic or subclavian arteries. CERVICAL CAROTID ARTERIES: Mild calcified plaque along the bilateral carotid bulbs and proximal cervical ICAs without hemodynamically significant stenosis. No dissection or arterial injury. 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 No dissection or arterial injury. LUNGS AND MEDIASTINUM: Unremarkable. SOFT TISSUES: No acute abnormality. BONES: No acute abnormality. CTA HEAD: ANTERIOR CIRCULATION: No significant stenosis of the internal carotid arteries. No significant stenosis of the anterior cerebral arteries. No significant stenosis of the middle cerebral arteries. No aneurysm. POSTERIOR CIRCULATION: Atherosclerotic calcifications of the ICA siphons without significant stenosis or aneurysm. No significant stenosis of the posterior cerebral arteries. No significant stenosis of the basilar artery. No aneurysm. OTHER: No dural venous sinus thrombosis on this non-dedicated study. IMPRESSION: 1. No large vessel occlusion in the head or neck vessels. 2. 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 Code stroke results were communicated to Dr. Lindzen at 7:03pm on 06/28/2024 by secure text paging. Electronically signed by: Ryan Chess MD 06/28/2024 07:05 PM EDT RP Workstation: [id number]   CT Head Wo Contrast Result Date: 06/28/2024 CLINICAL DATA:  Headache,  increasing frequency or severity EXAM: CT HEAD WITHOUT CONTRAST TECHNIQUE: Contiguous axial images were obtained from the base of the skull through the vertex without intravenous contrast. RADIATION DOSE REDUCTION: This exam was performed according to the departmental dose-optimization program which includes automated exposure control, adjustment of the mA and/or kV according to patient size and/or use of iterative reconstruction technique. COMPARISON:  CT head 11/17/2021 FINDINGS: Brain: Vague hypoattenuation in the right temporal lobe compared to the left (circa series 2/image 10). No intracranial hemorrhage or mass effect. No hydrocephalus. No extra-axial fluid collection. Mild cerebral atrophy. Age advanced chronic small vessel ischemic disease. Vascular: No hyperdense vessel. Intracranial arterial calcification. Skull: No fracture or focal lesion. Sinuses/Orbits: No acute finding. Other: None. IMPRESSION: Vague hypoattenuation in the right temporal lobe compared  to the left. This is favored artifactual however developing infarct or HSV encephalitis could have this appearance. MRI is recommended. Critical Value/emergent results were called by telephone at the time of interpretation on 06/28/2024 at 5:42 pm to provider ANDREW TEE , who verbally acknowledged these results. Electronically Signed   By: Norman Gatlin M.D.   On: 06/28/2024 17:53   DG Chest Port 1 View Result Date: 06/28/2024 CLINICAL DATA:  68 year old male with possible sepsis. EXAM: PORTABLE CHEST 1 VIEW COMPARISON:  Chest radiographs 11/17/2021 and earlier. FINDINGS: Portable AP semi upright view at 1721 hours. Considerably lower lung volumes, accentuated cardiac and mediastinal contours. Visualized tracheal air column is within normal limits. Allowing for portable technique the lungs are clear. No pneumothorax or pleural effusion. Negative visible bowel gas. No acute osseous abnormality identified. IMPRESSION: Low lung volumes, otherwise no  acute cardiopulmonary abnormality. Electronically Signed   By: VEAR Hurst M.D.   On: 06/28/2024 17:29    EKG: Independently reviewed. ***  Assessment/Plan Principal Problem:   AKI (acute kidney injury) (HCC)   ***  DVT prophylaxis: *** (Lovenox/Heparin/SCD's/anticoagulated/None (if comfort care) Code Status: *** (Full/Partial (specify details) Family Communication: *** (Specify name, relationship. Do not write discussed with patient. Specify tel # if discussed over the phone) Disposition Plan: *** (specify when and where you expect patient to be discharged) Consults called: *** (with names) Admission status: *** (inpatient / obs / tele / medical floor / SDU)   Camila DELENA Ned MD [facility] Pager [phone]  If 7PM-7AM, please contact night-coverage [email or web address] Password [facility]  06/28/2024, 11:41 PM

## 2024-06-28 NOTE — Progress Notes (Signed)
 Pharmacy Antibiotic Note  Cameron [name] is a 68 y.o. male admitted on 06/28/2024 with sepsis, concern for CNS infection.  Pharmacy has been consulted for acyclovir  dosing.  Plan: Acyclovir  10mg /kg IV q 24h IVF LR@150  Monitor renal function, Cx/LP to narrow     Temp (24hrs), Avg:102 F (38.9 C), Min:102 F (38.9 C), Max:102 F (38.9 C)  Recent Labs  Lab 06/28/24 1705 06/28/24 1733  WBC 9.6  --   CREATININE 2.31*  --   LATICACIDVEN  --  2.4*    CrCl cannot be calculated (Unknown ideal weight.).    Allergies  Allergen Reactions   Penicillins Shortness Of Breath and Swelling    Has patient had a PCN reaction causing immediate rash, facial/tongue/throat swelling, SOB or lightheadedness with hypotension: yes Has patient had a PCN reaction causing severe rash involving mucus membranes or skin necrosis: no Has patient had a PCN reaction that required hospitalization: yes,patient  was admitted   Did a PCN reaction occurring within the last 10 years: no If all of the above answers are NO, then may proceed with Cephalosporin use.     Dorn Poot, PharmD, [facility] Clinical Pharmacist ED Pharmacist Phone # [phone] 06/28/2024 6:38 PM

## 2024-06-28 NOTE — Sepsis Progress Note (Signed)
 Sepsis protocol is being followed by [facility].

## 2024-06-28 NOTE — ED Triage Notes (Signed)
 Pt BIB [facility] from home d/t respiratory distress. Pt was outside in a closed car for unknown amoung time. EMS witnessed seizure like activities . No hx of seizure. Initial temp was really high. Pt was unresponsive upon EMS arrival. Pt started becoming combative after started cooling down a little bit. Total IV 7.5 versed given by EMS. Bagged en route w ems.   Pulse 155  BP 138/88

## 2024-06-29 ENCOUNTER — Encounter (HOSPITAL_COMMUNITY): Payer: Self-pay | Admitting: Internal Medicine

## 2024-06-29 ENCOUNTER — Other Ambulatory Visit: Payer: Self-pay

## 2024-06-29 ENCOUNTER — Inpatient Hospital Stay (HOSPITAL_COMMUNITY)

## 2024-06-29 DIAGNOSIS — R569 Unspecified convulsions: Secondary | ICD-10-CM

## 2024-06-29 DIAGNOSIS — R55 Syncope and collapse: Secondary | ICD-10-CM

## 2024-06-29 DIAGNOSIS — N179 Acute kidney failure, unspecified: Secondary | ICD-10-CM | POA: Diagnosis not present

## 2024-06-29 LAB — BASIC METABOLIC PANEL WITH GFR
Anion gap: 17 — ABNORMAL HIGH (ref 5–15)
BUN: 25 mg/dL — ABNORMAL HIGH (ref 8–23)
CO2: 14 mmol/L — ABNORMAL LOW (ref 22–32)
Calcium: 8.6 mg/dL — ABNORMAL LOW (ref 8.9–10.3)
Chloride: 107 mmol/L (ref 98–111)
Creatinine, Ser: 2.11 mg/dL — ABNORMAL HIGH (ref 0.61–1.24)
GFR, Estimated: 34 mL/min — ABNORMAL LOW (ref >60)
Glucose, Bld: 62 mg/dL — ABNORMAL LOW (ref 70–99)
Potassium: 3.8 mmol/L (ref 3.5–5.1)
Sodium: 138 mmol/L (ref 135–145)

## 2024-06-29 LAB — ECHOCARDIOGRAM COMPLETE
AR max vel: 3.41 cm2
AV Peak grad: 7.6 mmHg
Ao pk vel: 1.38 m/s
Area-P 1/2: 2.99 cm2
Height: 73 in
S' Lateral: 3.5 cm
Weight: 2673.74 [oz_av]

## 2024-06-29 LAB — CBC
HCT: 33.6 % — ABNORMAL LOW (ref 39.0–52.0)
Hemoglobin: 11.1 g/dL — ABNORMAL LOW (ref 13.0–17.0)
MCH: 36.5 pg — ABNORMAL HIGH (ref 26.0–34.0)
MCHC: 33 g/dL (ref 30.0–36.0)
MCV: 110.5 fL — ABNORMAL HIGH (ref 80.0–100.0)
Platelets: 100 K/uL — ABNORMAL LOW (ref 150–400)
RBC: 3.04 MIL/uL — ABNORMAL LOW (ref 4.22–5.81)
RDW: 14.8 % (ref 11.5–15.5)
WBC: 14.2 K/uL — ABNORMAL HIGH (ref 4.0–10.5)
nRBC: 0 % (ref 0.0–0.2)

## 2024-06-29 MED ORDER — SODIUM CHLORIDE 0.9 % IV SOLN: INTRAVENOUS | Status: DC

## 2024-06-29 MED ORDER — AMLODIPINE BESYLATE 10 MG PO TABS
10.0000 mg | ORAL_TABLET | Freq: Every day | ORAL | Status: DC
  Administered 2024-06-29 – 2024-07-01 (×3): 10 mg via ORAL
  Filled 2024-06-29 (×2): qty 1
  Filled 2024-06-29: qty 2

## 2024-06-29 MED ORDER — LACTATED RINGERS IV SOLN: INTRAVENOUS | Status: DC

## 2024-06-29 NOTE — Procedures (Signed)
 Patient Name: Cameron [name]  MRN: [id number]  Epilepsy Attending: Arlin MALVA Krebs  Referring Physician/Provider: Debby Camila LABOR, MD  Date: 06/29/2024 Duration: 21.53 mins  Patient history: 68yo M with ams. EEG to evaluate for seizure  Level of alertness: Awake  AEDs during EEG study: None  Technical aspects: This EEG study was done with scalp electrodes positioned according to the 10-20 International system of electrode placement. Electrical activity was reviewed with band pass filter of 1-70Hz , sensitivity of 7 uV/mm, display speed of 44mm/sec with a 60Hz  notched filter applied as appropriate. EEG data were recorded continuously and digitally stored.  Video monitoring was available and reviewed as appropriate.  Description: The posterior dominant rhythm consists of 8-9 Hz activity of moderate voltage (25-35 uV) seen predominantly in posterior head regions, symmetric and reactive to eye opening and eye closing. Hyperventilation and photic stimulation were not performed.     EEG was technically difficult due to significant myogenic artifact.  IMPRESSION: This study is within normal limits. No seizures or epileptiform discharges were seen throughout the recording.  A normal interictal EEG does not exclude the diagnosis of epilepsy.  Keely Drennan O Zaeden Lastinger

## 2024-06-29 NOTE — Progress Notes (Addendum)
 PROGRESS NOTE    Cameron [name]  FMW:[id number]  DOB: [birth year]  DOA: 06/28/2024 PCP: [facility]:   Hospital course:  68 year old man with HTN was brought into ED by EMS after he was found unresponsive in a closed car in a parking lot.  There was possibility of seizure-like activity.  Workup in ED was notable for temperature of 102, creatinine of 2.3 with baseline of 0.93, CK2 78 and moderate hemoglobin on urine, lactic acid 2.4.  MRI was negative.  Patient was treated with IV fluid resuscitation, cooling and empiric antibiotics.  Subjective:  Patient states his main concern today is voluminous diarrhea that he has had several times over the course of the night.  Patient denies any confusion, knows that he passed out in his car and knows that he is in the hospital at present.   Objective: Vitals:   06/29/24 1500 06/29/24 1515 06/29/24 1519 06/29/24 1521  BP: (!) 131/111 (!) 165/89    Pulse: 63 73    Resp: 20 20    Temp:    98.4 F (36.9 C)  TempSrc:    Oral  SpO2: (!) 89% 100%    Weight:   75.8 kg   Height:   6' 1 (1.854 m)     Intake/Output Summary (Last 24 hours) at 06/29/2024 1815 Last data filed at 06/28/2024 2148 Gross per 24 hour  Intake 2099.59 ml  Output --  Net 2099.59 ml   Filed Weights   06/28/24 1845 06/29/24 1519  Weight: 75.8 kg 75.8 kg     Exam:  General: Spry man lying in bed, comfortable, conversational Eyes: sclera anicteric, conjuctiva mild injection bilaterally CVS: S1-S2, regular  Respiratory:  decreased air entry bilaterally secondary to decreased inspiratory effort, rales at bases  GI: NABS, soft, NT  LE: Warm and well-perfused Neuro: A/O x 3,  grossly nonfocal.  Psych: patient is logical and coherent, judgement and insight appear normal, mood and affect appropriate to situation.  Data Reviewed:  Basic Metabolic Panel: Recent Labs  Lab 06/28/24 1705 06/28/24 1734 06/29/24 0753  NA 142 144 138  K  2.9* 3.0* 3.8  CL 117*  --  107  CO2 19*  --  14*  GLUCOSE 109*  --  62*  BUN 23  --  25*  CREATININE 2.31*  --  2.11*  CALCIUM 8.4*  --  8.6*    CBC: Recent Labs  Lab 06/28/24 1705 06/28/24 1734 06/29/24 0753  WBC 9.6  --  14.2*  NEUTROABS 8.3*  --   --   HGB 10.3* 9.9* 11.1*  HCT 30.0* 29.0* 33.6*  MCV 107.1*  --  110.5*  PLT 102*  --  100*     Scheduled Meds: Continuous Infusions:   Assessment & Plan:   Acute encephalopathy--now resolved Heat stroke Possible seizure activity Patient's mental status is now back to baseline There are no focal deficits CT/MRI are negative EEG completed today is WNL Temperature is normalized  AKI Some improvement with fluid resuscitation Patient received 2 and half liters LR and then is on LR at 150 Continue IV fluid resuscitation with LR at 150  Diarrhea Onset after admission to the hospital GI panel is ordered and pending Continue fluid resuscitation and repletion of electrolytes as warranted Patient received 1 dose of vancomycin , Flagyl  and ceftriaxone on admission, these were not continued  Hematuria Patient states he had hematuria several years ago Will repeat Do not think this is myoglobinuria, CPK was  only mid 500s Further workup as an outpatient is warranted  Hypokalemia Resolved with repletion  HTN Restart amlodipine   Macrocytic anemia Patient with markedly elevated MCV at 1100 We will order B12 and folate Will need workup if not already initiated as an outpatient    DVT prophylaxis: SCD Code Status: Full Family Communication: None today     Studies: EEG adult Result Date: 06/29/2024 Shelton Arlin KIDD, MD     06/29/2024 10:38 AM Patient Name: Cameron [name] MRN: [id number] Epilepsy Attending: Arlin KIDD Shelton Referring Physician/Provider: Debby Camila LABOR, MD Date: 06/29/2024 Duration: 21.53 mins Patient history: 68yo M with ams. EEG to evaluate for seizure Level of alertness: Awake AEDs during EEG  study: None Technical aspects: This EEG study was done with scalp electrodes positioned according to the 10-20 International system of electrode placement. Electrical activity was reviewed with band pass filter of 1-70Hz , sensitivity of 7 uV/mm, display speed of 48mm/sec with a 60Hz  notched filter applied as appropriate. EEG data were recorded continuously and digitally stored.  Video monitoring was available and reviewed as appropriate. Description: The posterior dominant rhythm consists of 8-9 Hz activity of moderate voltage (25-35 uV) seen predominantly in posterior head regions, symmetric and reactive to eye opening and eye closing. Hyperventilation and photic stimulation were not performed.   EEG was technically difficult due to significant myogenic artifact. IMPRESSION: This study is within normal limits. No seizures or epileptiform discharges were seen throughout the recording. A normal interictal EEG does not exclude the diagnosis of epilepsy. Arlin KIDD Shelton   MR BRAIN WO CONTRAST Result Date: 06/28/2024 CLINICAL DATA:  Mental status change, unknown cause EXAM: MRI HEAD WITHOUT CONTRAST TECHNIQUE: Multiplanar, multiecho pulse sequences of the brain and surrounding structures were obtained without intravenous contrast. COMPARISON:  CT head from earlier today. FINDINGS: Motion limited incomplete exam. SWI imaging not performed. Within that limitation: Brain: No acute infarction, hemorrhage, hydrocephalus, extra-axial collection or mass lesion. Mild scattered T2/FLAIR hyperintensities within the white matter, nonspecific but compatible with chronic microvascular ischemic disease. Vascular: Major arterial flow voids are maintained at the skull base. Skull and upper cervical spine: Normal marrow signal. Sinuses/Orbits: Clear sinuses.  No acute orbital findings. Other: No mastoid effusions IMPRESSION: No evidence of acute intracranial abnormality on motion limited assessment. Electronically Signed   By:  Gilmore GORMAN Molt M.D.   On: 06/28/2024 22:20   CT ANGIO HEAD NECK W WO CM Result Date: 06/28/2024 EXAM: CTA HEAD AND NECK WITH AND WITHOUT 06/28/2024 06:50:39 PM TECHNIQUE: CTA of the head and neck was performed with and without the administration of intravenous contrast. Multiplanar 2D and/or 3D reformatted images are provided for review. Automated exposure control, iterative reconstruction, and/or weight based adjustment of the mA/kV was utilized to reduce the radiation dose to as low as reasonably achievable. Stenosis of the internal carotid arteries measured using NASCET criteria. COMPARISON: Head CT 06/28/2024 CLINICAL HISTORY: Neuro deficit, acute, stroke suspected. FINDINGS: CTA NECK: AORTIC ARCH AND ARCH VESSELS: Atherosclerotic calcifications of the aortic arch and arch vessel origins, which remain patent. No dissection or arterial injury. No significant stenosis of the brachiocephalic or subclavian arteries. CERVICAL CAROTID ARTERIES: Mild calcified plaque along the bilateral carotid bulbs and proximal cervical ICAs without hemodynamically significant stenosis. No dissection or arterial injury. 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 No dissection or arterial injury. LUNGS AND MEDIASTINUM: Unremarkable. SOFT TISSUES: No acute abnormality. BONES: No acute abnormality. CTA HEAD: ANTERIOR  CIRCULATION: No significant stenosis of the internal carotid arteries. No significant stenosis of the anterior cerebral arteries. No significant stenosis of the middle cerebral arteries. No aneurysm. POSTERIOR CIRCULATION: Atherosclerotic calcifications of the ICA siphons without significant stenosis or aneurysm. No significant stenosis of the posterior cerebral arteries. No significant stenosis of the basilar artery. No aneurysm. OTHER: No dural venous sinus thrombosis on this non-dedicated study. IMPRESSION: 1. No large vessel occlusion in the  head or neck vessels. 2. 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 Code stroke results were communicated to Dr. Lindzen at 7:03pm on 06/28/2024 by secure text paging. Electronically signed by: Ryan Chess MD 06/28/2024 07:05 PM EDT RP Workstation: [id number]   CT Head Wo Contrast Result Date: 06/28/2024 CLINICAL DATA:  Headache, increasing frequency or severity EXAM: CT HEAD WITHOUT CONTRAST TECHNIQUE: Contiguous axial images were obtained from the base of the skull through the vertex without intravenous contrast. RADIATION DOSE REDUCTION: This exam was performed according to the departmental dose-optimization program which includes automated exposure control, adjustment of the mA and/or kV according to patient size and/or use of iterative reconstruction technique. COMPARISON:  CT head 11/17/2021 FINDINGS: Brain: Vague hypoattenuation in the right temporal lobe compared to the left (circa series 2/image 10). No intracranial hemorrhage or mass effect. No hydrocephalus. No extra-axial fluid collection. Mild cerebral atrophy. Age advanced chronic small vessel ischemic disease. Vascular: No hyperdense vessel. Intracranial arterial calcification. Skull: No fracture or focal lesion. Sinuses/Orbits: No acute finding. Other: None. IMPRESSION: Vague hypoattenuation in the right temporal lobe compared to the left. This is favored artifactual however developing infarct or HSV encephalitis could have this appearance. MRI is recommended. Critical Value/emergent results were called by telephone at the time of interpretation on 06/28/2024 at 5:42 pm to provider ANDREW TEE , who verbally acknowledged these results. Electronically Signed   By: Norman Gatlin M.D.   On: 06/28/2024 17:53   DG Chest Port 1 View Result Date: 06/28/2024 CLINICAL DATA:  68 year old male with possible sepsis. EXAM: PORTABLE CHEST 1 VIEW COMPARISON:  Chest radiographs 11/17/2021 and earlier. FINDINGS:  Portable AP semi upright view at 1721 hours. Considerably lower lung volumes, accentuated cardiac and mediastinal contours. Visualized tracheal air column is within normal limits. Allowing for portable technique the lungs are clear. No pneumothorax or pleural effusion. Negative visible bowel gas. No acute osseous abnormality identified. IMPRESSION: Low lung volumes, otherwise no acute cardiopulmonary abnormality. Electronically Signed   By: VEAR Hurst M.D.   On: 06/28/2024 17:29    Principal Problem:   AKI (acute kidney injury) (HCC)     Deanette Tullius Vangie Pike, [facility]  If 7PM-7AM, please contact night-coverage [email or web address]   LOS: 1 day

## 2024-06-29 NOTE — Progress Notes (Signed)
 EEG complete, results are pending.

## 2024-06-29 NOTE — ED Notes (Signed)
 PT is having issues with diarrhea.

## 2024-06-29 NOTE — Progress Notes (Signed)
 Echocardiogram 2D Echocardiogram has been performed.  Cameron [name] 06/29/2024, 5:18 PM

## 2024-06-30 DIAGNOSIS — N179 Acute kidney failure, unspecified: Secondary | ICD-10-CM | POA: Diagnosis not present

## 2024-06-30 LAB — CBC WITH DIFFERENTIAL/PLATELET
Abs Immature Granulocytes: 0.09 K/uL — ABNORMAL HIGH (ref 0.00–0.07)
Basophils Absolute: 0.1 K/uL (ref 0.0–0.1)
Basophils Relative: 0 %
Eosinophils Absolute: 0 K/uL (ref 0.0–0.5)
Eosinophils Relative: 0 %
HCT: 31.5 % — ABNORMAL LOW (ref 39.0–52.0)
Hemoglobin: 10.7 g/dL — ABNORMAL LOW (ref 13.0–17.0)
Immature Granulocytes: 1 %
Lymphocytes Relative: 7 %
Lymphs Abs: 0.8 K/uL (ref 0.7–4.0)
MCH: 36 pg — ABNORMAL HIGH (ref 26.0–34.0)
MCHC: 34 g/dL (ref 30.0–36.0)
MCV: 106.1 fL — ABNORMAL HIGH (ref 80.0–100.0)
Monocytes Absolute: 0.9 K/uL (ref 0.1–1.0)
Monocytes Relative: 8 %
Neutro Abs: 9.7 K/uL — ABNORMAL HIGH (ref 1.7–7.7)
Neutrophils Relative %: 84 %
Platelets: 91 K/uL — ABNORMAL LOW (ref 150–400)
RBC: 2.97 MIL/uL — ABNORMAL LOW (ref 4.22–5.81)
RDW: 13.8 % (ref 11.5–15.5)
WBC: 11.6 K/uL — ABNORMAL HIGH (ref 4.0–10.5)
nRBC: 0 % (ref 0.0–0.2)

## 2024-06-30 LAB — GASTROINTESTINAL PANEL BY PCR, STOOL (REPLACES STOOL CULTURE)
Adenovirus F40/41: NOT DETECTED
Astrovirus: NOT DETECTED
Campylobacter species: NOT DETECTED
Cryptosporidium: NOT DETECTED
Cyclospora cayetanensis: NOT DETECTED
Entamoeba histolytica: NOT DETECTED
Enteroaggregative E coli (EAEC): NOT DETECTED
Enteropathogenic E coli (EPEC): DETECTED — AB
Enterotoxigenic E coli (ETEC): NOT DETECTED
Giardia lamblia: DETECTED — AB
Norovirus GI/GII: NOT DETECTED
Plesimonas shigelloides: NOT DETECTED
Rotavirus A: NOT DETECTED
Salmonella species: NOT DETECTED
Sapovirus (I, II, IV, and V): NOT DETECTED
Shiga like toxin producing E coli (STEC): NOT DETECTED
Shigella/Enteroinvasive E coli (EIEC): NOT DETECTED
Vibrio cholerae: NOT DETECTED
Vibrio species: NOT DETECTED
Yersinia enterocolitica: NOT DETECTED

## 2024-06-30 LAB — CK
Total CK: 4450 U/L — ABNORMAL HIGH (ref 49–397)
Total CK: 3081 U/L — ABNORMAL HIGH (ref 49–397)

## 2024-06-30 LAB — IRON AND TIBC
Iron: 109 ug/dL (ref 45–182)
Saturation Ratios: 35 % (ref 17.9–39.5)
TIBC: 315 ug/dL (ref 250–450)
UIBC: 206 ug/dL

## 2024-06-30 LAB — COMPREHENSIVE METABOLIC PANEL WITH GFR
ALT: 51 U/L — ABNORMAL HIGH (ref 0–44)
AST: 118 U/L — ABNORMAL HIGH (ref 15–41)
Albumin: 3.6 g/dL (ref 3.5–5.0)
Alkaline Phosphatase: 60 U/L (ref 38–126)
Anion gap: 12 (ref 5–15)
BUN: 19 mg/dL (ref 8–23)
CO2: 20 mmol/L — ABNORMAL LOW (ref 22–32)
Calcium: 8.9 mg/dL (ref 8.9–10.3)
Chloride: 108 mmol/L (ref 98–111)
Creatinine, Ser: 1.6 mg/dL — ABNORMAL HIGH (ref 0.61–1.24)
GFR, Estimated: 47 mL/min — ABNORMAL LOW (ref >60)
Glucose, Bld: 85 mg/dL (ref 70–99)
Potassium: 3.8 mmol/L (ref 3.5–5.1)
Sodium: 140 mmol/L (ref 135–145)
Total Bilirubin: 1 mg/dL (ref 0.0–1.2)
Total Protein: 6.2 g/dL — ABNORMAL LOW (ref 6.5–8.1)

## 2024-06-30 LAB — VITAMIN B12: Vitamin B-12: 276 pg/mL (ref 180–914)

## 2024-06-30 LAB — RETICULOCYTES
Immature Retic Fract: 25 % — ABNORMAL HIGH (ref 2.3–15.9)
RBC.: 3.01 MIL/uL — ABNORMAL LOW (ref 4.22–5.81)
Retic Count, Absolute: 74 K/uL (ref 19.0–186.0)
Retic Ct Pct: 2.5 % (ref 0.4–3.1)

## 2024-06-30 LAB — FERRITIN: Ferritin: 361 ng/mL — ABNORMAL HIGH (ref 24–336)

## 2024-06-30 LAB — FOLATE: Folate: 4.6 ng/mL — ABNORMAL LOW (ref >5.9)

## 2024-06-30 MED ORDER — LABETALOL HCL 5 MG/ML IV SOLN
20.0000 mg | INTRAVENOUS | Status: DC | PRN
  Administered 2024-06-30: 20 mg via INTRAVENOUS
  Filled 2024-06-30: qty 4

## 2024-06-30 MED ORDER — METRONIDAZOLE 500 MG PO TABS
500.0000 mg | ORAL_TABLET | Freq: Two times a day (BID) | ORAL | Status: DC
  Administered 2024-06-30 – 2024-07-01 (×3): 500 mg via ORAL
  Filled 2024-06-30 (×3): qty 1

## 2024-06-30 MED ORDER — LACTATED RINGERS IV SOLN: INTRAVENOUS | Status: AC

## 2024-06-30 MED ORDER — FOLIC ACID 1 MG PO TABS
1.0000 mg | ORAL_TABLET | Freq: Every day | ORAL | Status: DC
  Administered 2024-06-30 – 2024-07-01 (×2): 1 mg via ORAL
  Filled 2024-06-30 (×2): qty 1

## 2024-06-30 NOTE — TOC CM/SW Note (Signed)
 Transition of Care [facility]) - Inpatient Brief Assessment   Patient Details  Name: Cameron [name] MRN: [id number] Date of Birth: 09-Nov-1956  Transition of Care [facility] CM/SW Contact:    Waddell Barnie Rama, RN Phone Number: 06/30/2024, 11:11 AM   Clinical Narrative: From home alone, has PCP and insurance on file, states has no HH services in place at this time or DME at home.  States he will walk home at dc and he has no family  support system, states gets medications from [facility] on [address].  Pta self ambulatory.   Transition of Care Asessment: Insurance and Status: Insurance coverage has been reviewed Patient has primary care physician: Yes Home environment has been reviewed: home alone Prior level of function:: indep Prior/Current Home Services: No current home services Social Drivers of Health Review: SDOH reviewed no interventions necessary Readmission risk has been reviewed: Yes Transition of care needs: no transition of care needs at this time

## 2024-06-30 NOTE — ED Notes (Signed)
 2nd attempt successful in alerting floor of PT arrival

## 2024-06-30 NOTE — ED Notes (Signed)
 1ST attempt to made to 3E to make aware of PT arrival.

## 2024-06-30 NOTE — Plan of Care (Signed)

## 2024-06-30 NOTE — Progress Notes (Signed)
 PROGRESS NOTE    Cameron [name]  FMW:[id number] DOB: 04-12-1956 DOA: 06/28/2024 PCP: [facility]    Brief Narrative:  68 year old gentleman with history of hypertension on amlodipine  brought to emergency room by EMS, he was found unresponsive in a closed car in the parking lot.  Patient stated he passed out.  There was suspicion of seizure-like activities.  In the emergency room, temperature 102, creatinine 2.3, CPK 278, lactic acid 2.4.  MRI brain was negative.  Treated with IV fluids, cooling and empiric antibiotics.  Admitted for AKI, rhabdomyolysis.  Patient reported ongoing diarrhea.  Stool exam consistent with enteropathogenic E. coli and Giardia. Clinically improving now.  Subjective: Patient seen and examined.  Patient tells me that he is ready to go home.  However, he had 7 small volume loose stool overnight.  He is able to eat okay.  However appetite is very poor.  He wonders what happened to him.  He was likely very dehydrated with E. coli and Giardia. Renal functions improving.  CK level elevated. IV has come off.  Assessment & Plan:   Acute encephalopathy secondary to heatstroke: Suspect seizure-like activity however no repeat seizures. Mental status improved.  MRI negative.  EEG normal.  Temperature is normalized.  Recommend close outpatient follow-up and precautions.  AKI, rhabdomyolysis: CPK elevated.  On maintenance IV fluids.  Renal functions improving.  Continue LR at 150 mL/h next 24 hours.  Monitor urine output.  Monitor CK levels every 12 hours.  Acute gastroenteritis: On IV fluids.  Stool positive for E. coli and Giardia.  Will treat for Giardia with metronidazole  500 mg twice daily for 7 days.  Macrocytosis: B12 normal.  Folic acid  less than 5.  Given folic acid  supplement.   DVT prophylaxis: SCDs Start: 06/29/24 0750   Code Status: Full code Family Communication: None at the bedside Disposition Plan: Status is: Inpatient Remains inpatient appropriate  because: Significantly elevated CPK, needs IV fluids     Consultants:  None  Procedures:  None  Antimicrobials:  Flagyl  7/15---     Objective: Vitals:   06/30/24 0044 06/30/24 0100 06/30/24 0441 06/30/24 0720  BP: (!) 185/97 (!) 178/100 (!) 134/98 (!) 162/93  Pulse: 91 85 94 91  Resp: 19 19 (!) 21 17  Temp: 97.7 F (36.5 C) 98 F (36.7 C) 98.6 F (37 C) 97.7 F (36.5 C)  TempSrc: Oral  Oral Oral  SpO2: 99%  98% 97%  Weight: 72 kg     Height: 6' (1.829 m)       Intake/Output Summary (Last 24 hours) at 06/30/2024 0808 Last data filed at 06/30/2024 0442 Gross per 24 hour  Intake 1426.36 ml  Output 300 ml  Net 1126.36 ml   Filed Weights   06/28/24 1845 06/29/24 1519 06/30/24 0044  Weight: 75.8 kg 75.8 kg 72 kg    Examination:  General: Fairly comfortable.  Interactive and pleasant. Cardiovascular: S1-S2 normal.  Regular rate rhythm. Respiratory: Bilateral clear.  No added sounds. Gastrointestinal: Soft.  Nontender.  Bowel sounds present. Ext: No swelling or edema.  No cyanosis. Neuro: Alert awake and oriented.     Data Reviewed: I have personally reviewed following labs and imaging studies  CBC: Recent Labs  Lab 06/28/24 1705 06/28/24 1734 06/29/24 0753 06/30/24 0308  WBC 9.6  --  14.2* 11.6*  NEUTROABS 8.3*  --   --  9.7*  HGB 10.3* 9.9* 11.1* 10.7*  HCT 30.0* 29.0* 33.6* 31.5*  MCV 107.1*  --  110.5*  106.1*  PLT 102*  --  100* 91*   Basic Metabolic Panel: Recent Labs  Lab 06/28/24 1705 06/28/24 1734 06/29/24 0753 06/30/24 0308  NA 142 144 138 140  K 2.9* 3.0* 3.8 3.8  CL 117*  --  107 108  CO2 19*  --  14* 20*  GLUCOSE 109*  --  62* 85  BUN 23  --  25* 19  CREATININE 2.31*  --  2.11* 1.60*  CALCIUM 8.4*  --  8.6* 8.9   GFR: Estimated Creatinine Clearance: 45.6 mL/min (A) (by C-G formula based on SCr of 1.6 mg/dL (H)). Liver Function Tests: Recent Labs  Lab 06/28/24 1705 06/30/24 0308  AST 26 118*  ALT 15 51*  ALKPHOS 49 60   BILITOT 0.5 1.0  PROT 5.9* 6.2*  ALBUMIN 3.4* 3.6   No results for input(s): LIPASE, AMYLASE in the last 168 hours. No results for input(s): AMMONIA in the last 168 hours. Coagulation Profile: Recent Labs  Lab 06/28/24 1705  INR 1.1   Cardiac Enzymes: Recent Labs  Lab 06/28/24 1705 06/30/24 0308  CKTOTAL 278 4,450*   BNP (last 3 results) No results for input(s): PROBNP in the last 8760 hours. HbA1C: No results for input(s): HGBA1C in the last 72 hours. CBG: No results for input(s): GLUCAP in the last 168 hours. Lipid Profile: No results for input(s): CHOL, HDL, LDLCALC, TRIG, CHOLHDL, LDLDIRECT in the last 72 hours. Thyroid Function Tests: No results for input(s): TSH, T4TOTAL, FREET4, T3FREE, THYROIDAB in the last 72 hours. Anemia Panel: Recent Labs    06/30/24 0308  VITAMINB12 276  FOLATE 4.6*  FERRITIN 361*  TIBC 315  IRON 109  RETICCTPCT 2.5   Sepsis Labs: Recent Labs  Lab 06/28/24 1733 06/28/24 2041  LATICACIDVEN 2.4* 2.0*    Recent Results (from the past 240 hours)  Resp panel by RT-PCR (RSV, Flu A&B, Covid) Anterior Nasal Swab     Status: None   Collection Time: 06/28/24  5:14 PM   Specimen: Anterior Nasal Swab  Result Value Ref Range Status   SARS Coronavirus 2 by RT PCR NEGATIVE NEGATIVE Final   Influenza A by PCR NEGATIVE NEGATIVE Final   Influenza B by PCR NEGATIVE NEGATIVE Final    Comment: (NOTE) The Xpert Xpress SARS-CoV-2/FLU/RSV plus assay is intended as an aid in the diagnosis of influenza from Nasopharyngeal swab specimens and should not be used as a sole basis for treatment. Nasal washings and aspirates are unacceptable for Xpert Xpress SARS-CoV-2/FLU/RSV testing.  Fact Sheet for Patients: BloggerCourse.com  Fact Sheet for Healthcare Providers: SeriousBroker.it  This test is not yet approved or cleared by the [place]  FDA and has been  authorized for detection and/or diagnosis of SARS-CoV-2 by FDA under an Emergency Use Authorization (EUA). This EUA will remain in effect (meaning this test can be used) for the duration of the COVID-19 declaration under Section 564(b)(1) of the Act, 21 U.S.C. section 360bbb-3(b)(1), unless the authorization is terminated or revoked.     Resp Syncytial Virus by PCR NEGATIVE NEGATIVE Final    Comment: (NOTE) Fact Sheet for Patients: BloggerCourse.com  Fact Sheet for Healthcare Providers: SeriousBroker.it  This test is not yet approved or cleared by the [place]  FDA and has been authorized for detection and/or diagnosis of SARS-CoV-2 by FDA under an Emergency Use Authorization (EUA). This EUA will remain in effect (meaning this test can be used) for the duration of the COVID-19 declaration under Section 564(b)(1) of the Act, 21 U.S.C.  section 360bbb-3(b)(1), unless the authorization is terminated or revoked.  Performed at [facility], [address], [place], [place] [address]   Blood Culture (routine x 2)     Status: None (Preliminary result)   Collection Time: 06/28/24  5:18 PM   Specimen: BLOOD LEFT HAND  Result Value Ref Range Status   Specimen Description BLOOD LEFT HAND  Final   Special Requests   Final    BOTTLES DRAWN AEROBIC AND ANAEROBIC Blood Culture adequate volume   Culture   Final    NO GROWTH 2 DAYS Performed at [facility], [address], [place], [place] [address]    Report Status PENDING  Incomplete  Gastrointestinal Panel by PCR , Stool     Status: Abnormal   Collection Time: 06/29/24  3:54 AM   Specimen: Stool  Result Value Ref Range Status   Campylobacter species NOT DETECTED NOT DETECTED Final   Plesimonas shigelloides NOT DETECTED NOT DETECTED Final   Salmonella species NOT DETECTED NOT DETECTED Final   Yersinia enterocolitica NOT DETECTED NOT DETECTED Final   Vibrio species NOT  DETECTED NOT DETECTED Final   Vibrio cholerae NOT DETECTED NOT DETECTED Final   Enteroaggregative E coli (EAEC) NOT DETECTED NOT DETECTED Final   Enteropathogenic E coli (EPEC) DETECTED (A) NOT DETECTED Final    Comment: RESULT CALLED TO, READ BACK BY AND VERIFIED WITH: JAMA J., RN AT 9249 06/30/24 RAM    Enterotoxigenic E coli (ETEC) NOT DETECTED NOT DETECTED Final   Shiga like toxin producing E coli (STEC) NOT DETECTED NOT DETECTED Final   Shigella/Enteroinvasive E coli (EIEC) NOT DETECTED NOT DETECTED Final   Cryptosporidium NOT DETECTED NOT DETECTED Final   Cyclospora cayetanensis NOT DETECTED NOT DETECTED Final   Entamoeba histolytica NOT DETECTED NOT DETECTED Final   Giardia lamblia DETECTED (A) NOT DETECTED Final    Comment: RESULT CALLED TO, READ BACK BY AND VERIFIED WITH: JAMA J, RN AT 0750 06/30/24 RAM    Adenovirus F40/41 NOT DETECTED NOT DETECTED Final   Astrovirus NOT DETECTED NOT DETECTED Final   Norovirus GI/GII NOT DETECTED NOT DETECTED Final   Rotavirus A NOT DETECTED NOT DETECTED Final   Sapovirus (I, II, IV, and V) NOT DETECTED NOT DETECTED Final    Comment: Performed at [facility], [address], [place], [place] [address]         Radiology Studies: ECHOCARDIOGRAM COMPLETE Result Date: 06/29/2024    ECHOCARDIOGRAM REPORT   Patient Name:   Cameron [name] Date of Exam: 06/29/2024 Medical Rec #:  [id number]     Height:       73.0 in Accession #:    [id number]    Weight:       167.1 lb Date of Birth:  10-11-1956      BSA:          1.993 m Patient Age:    67 years      BP:           169/150 mmHg Patient Gender: M             HR:           83 bpm. Exam Location:  Inpatient Procedure: 2D Echo, Cardiac Doppler and Color Doppler (Both Spectral and Color            Flow Doppler were utilized during procedure). Indications:    Syncope R55  History:        Patient has no  prior history of Echocardiogram examinations.                 Risk Factors:Hypertension.   Sonographer:    Thea Norlander RCS Referring Phys: CAMILA DELENA NED  Sonographer Comments: Image acquisition challenging due to patient body habitus. IMPRESSIONS  1. Left ventricular ejection fraction, by estimation, is 55 to 60%. The left ventricle has normal function. The left ventricle has no regional wall motion abnormalities. Left ventricular diastolic parameters were normal.  2. Right ventricular systolic function is normal. The right ventricular size is mildly enlarged.  3. The mitral valve is grossly normal. No evidence of mitral valve regurgitation. No evidence of mitral stenosis.  4. The aortic valve is tricuspid. Aortic valve regurgitation is not visualized. No aortic stenosis is present.  5. Aortic dilatation noted. There is dilatation of the aortic root, measuring 40 mm. There is dilatation of the ascending aorta, measuring 39 mm.  6. The inferior vena cava is normal in size with <50% respiratory variability, suggesting right atrial pressure of 8 mmHg. Comparison(s): No prior Echocardiogram. FINDINGS  Left Ventricle: Left ventricular ejection fraction, by estimation, is 55 to 60%. The left ventricle has normal function. The left ventricle has no regional wall motion abnormalities. The left ventricular internal cavity size was normal in size. There is  no left ventricular hypertrophy. Left ventricular diastolic parameters were normal. Right Ventricle: The right ventricular size is mildly enlarged. No increase in right ventricular wall thickness. Right ventricular systolic function is normal. Left Atrium: Left atrial size was normal in size. Right Atrium: Right atrial size was normal in size. Pericardium: There is no evidence of pericardial effusion. Mitral Valve: The mitral valve is grossly normal. No evidence of mitral valve regurgitation. No evidence of mitral valve stenosis. Tricuspid Valve: The tricuspid valve is normal in structure. Tricuspid valve regurgitation is not demonstrated. No evidence  of tricuspid stenosis. Aortic Valve: The aortic valve is tricuspid. Aortic valve regurgitation is not visualized. No aortic stenosis is present. Aortic valve peak gradient measures 7.6 mmHg. Pulmonic Valve: The pulmonic valve was not well visualized. Pulmonic valve regurgitation is not visualized. No evidence of pulmonic stenosis. Aorta: Aortic dilatation noted. There is dilatation of the aortic root, measuring 40 mm. There is dilatation of the ascending aorta, measuring 39 mm. Venous: The inferior vena cava is normal in size with less than 50% respiratory variability, suggesting right atrial pressure of 8 mmHg. IAS/Shunts: The atrial septum is grossly normal.  LEFT VENTRICLE PLAX 2D LVIDd:         4.80 cm   Diastology LVIDs:         3.50 cm   LV e' medial:    6.85 cm/s LV PW:         1.10 cm   LV E/e' medial:  8.3 LV IVS:        0.90 cm   LV e' lateral:   7.72 cm/s LVOT diam:     2.40 cm   LV E/e' lateral: 7.4 LV SV:         89 LV SV Index:   45 LVOT Area:     4.52 cm  RIGHT VENTRICLE             IVC RV S prime:     13.40 cm/s  IVC diam: 1.70 cm TAPSE (M-mode): 3.1 cm LEFT ATRIUM             Index        RIGHT ATRIUM  Index LA diam:        3.50 cm 1.76 cm/m   RA Area:     19.20 cm LA Vol (A2C):   38.7 ml 19.41 ml/m  RA Volume:   54.00 ml  27.09 ml/m LA Vol (A4C):   26.9 ml 13.49 ml/m LA Biplane Vol: 32.3 ml 16.20 ml/m  AORTIC VALVE AV Area (Vmax): 3.41 cm AV Vmax:        138.00 cm/s AV Peak Grad:   7.6 mmHg LVOT Vmax:      104.00 cm/s LVOT Vmean:     66.000 cm/s LVOT VTI:       0.197 m  AORTA Ao Root diam: 4.00 cm Ao Asc diam:  3.90 cm MITRAL VALVE MV Area (PHT): 2.99 cm    SHUNTS MV Decel Time: 254 msec    Systemic VTI:  0.20 m MV E velocity: 57.10 cm/s  Systemic Diam: 2.40 cm MV A velocity: 86.50 cm/s MV E/A ratio:  0.66 Sunit Tolia Electronically signed by Madonna Large Signature Date/Time: 06/29/2024/6:51:38 PM    Final    EEG adult Result Date: 06/29/2024 Shelton Arlin KIDD, MD     06/29/2024  10:38 AM Patient Name: Cameron [name] MRN: [id number] Epilepsy Attending: Arlin KIDD Shelton Referring Physician/Provider: Debby Camila LABOR, MD Date: 06/29/2024 Duration: 21.53 mins Patient history: 68yo M with ams. EEG to evaluate for seizure Level of alertness: Awake AEDs during EEG study: None Technical aspects: This EEG study was done with scalp electrodes positioned according to the 10-20 International system of electrode placement. Electrical activity was reviewed with band pass filter of 1-70Hz , sensitivity of 7 uV/mm, display speed of 35mm/sec with a 60Hz  notched filter applied as appropriate. EEG data were recorded continuously and digitally stored.  Video monitoring was available and reviewed as appropriate. Description: The posterior dominant rhythm consists of 8-9 Hz activity of moderate voltage (25-35 uV) seen predominantly in posterior head regions, symmetric and reactive to eye opening and eye closing. Hyperventilation and photic stimulation were not performed.   EEG was technically difficult due to significant myogenic artifact. IMPRESSION: This study is within normal limits. No seizures or epileptiform discharges were seen throughout the recording. A normal interictal EEG does not exclude the diagnosis of epilepsy. Arlin KIDD Shelton   MR BRAIN WO CONTRAST Result Date: 06/28/2024 CLINICAL DATA:  Mental status change, unknown cause EXAM: MRI HEAD WITHOUT CONTRAST TECHNIQUE: Multiplanar, multiecho pulse sequences of the brain and surrounding structures were obtained without intravenous contrast. COMPARISON:  CT head from earlier today. FINDINGS: Motion limited incomplete exam. SWI imaging not performed. Within that limitation: Brain: No acute infarction, hemorrhage, hydrocephalus, extra-axial collection or mass lesion. Mild scattered T2/FLAIR hyperintensities within the white matter, nonspecific but compatible with chronic microvascular ischemic disease. Vascular: Major arterial flow voids are  maintained at the skull base. Skull and upper cervical spine: Normal marrow signal. Sinuses/Orbits: Clear sinuses.  No acute orbital findings. Other: No mastoid effusions IMPRESSION: No evidence of acute intracranial abnormality on motion limited assessment. Electronically Signed   By: Gilmore GORMAN Molt M.D.   On: 06/28/2024 22:20   CT ANGIO HEAD NECK W WO CM Result Date: 06/28/2024 EXAM: CTA HEAD AND NECK WITH AND WITHOUT 06/28/2024 06:50:39 PM TECHNIQUE: CTA of the head and neck was performed with and without the administration of intravenous contrast. Multiplanar 2D and/or 3D reformatted images are provided for review. Automated exposure control, iterative reconstruction, and/or weight based adjustment of the mA/kV was utilized to reduce the radiation dose to  as low as reasonably achievable. Stenosis of the internal carotid arteries measured using NASCET criteria. COMPARISON: Head CT 06/28/2024 CLINICAL HISTORY: Neuro deficit, acute, stroke suspected. FINDINGS: CTA NECK: AORTIC ARCH AND ARCH VESSELS: Atherosclerotic calcifications of the aortic arch and arch vessel origins, which remain patent. No dissection or arterial injury. No significant stenosis of the brachiocephalic or subclavian arteries. CERVICAL CAROTID ARTERIES: Mild calcified plaque along the bilateral carotid bulbs and proximal cervical ICAs without hemodynamically significant stenosis. No dissection or arterial injury. 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 No dissection or arterial injury. LUNGS AND MEDIASTINUM: Unremarkable. SOFT TISSUES: No acute abnormality. BONES: No acute abnormality. CTA HEAD: ANTERIOR CIRCULATION: No significant stenosis of the internal carotid arteries. No significant stenosis of the anterior cerebral arteries. No significant stenosis of the middle cerebral arteries. No aneurysm. POSTERIOR CIRCULATION: Atherosclerotic calcifications of the ICA  siphons without significant stenosis or aneurysm. No significant stenosis of the posterior cerebral arteries. No significant stenosis of the basilar artery. No aneurysm. OTHER: No dural venous sinus thrombosis on this non-dedicated study. IMPRESSION: 1. No large vessel occlusion in the head or neck vessels. 2. 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 Code stroke results were communicated to Dr. Lindzen at 7:03pm on 06/28/2024 by secure text paging. Electronically signed by: Ryan Chess MD 06/28/2024 07:05 PM EDT RP Workstation: [id number]   CT Head Wo Contrast Result Date: 06/28/2024 CLINICAL DATA:  Headache, increasing frequency or severity EXAM: CT HEAD WITHOUT CONTRAST TECHNIQUE: Contiguous axial images were obtained from the base of the skull through the vertex without intravenous contrast. RADIATION DOSE REDUCTION: This exam was performed according to the departmental dose-optimization program which includes automated exposure control, adjustment of the mA and/or kV according to patient size and/or use of iterative reconstruction technique. COMPARISON:  CT head 11/17/2021 FINDINGS: Brain: Vague hypoattenuation in the right temporal lobe compared to the left (circa series 2/image 10). No intracranial hemorrhage or mass effect. No hydrocephalus. No extra-axial fluid collection. Mild cerebral atrophy. Age advanced chronic small vessel ischemic disease. Vascular: No hyperdense vessel. Intracranial arterial calcification. Skull: No fracture or focal lesion. Sinuses/Orbits: No acute finding. Other: None. IMPRESSION: Vague hypoattenuation in the right temporal lobe compared to the left. This is favored artifactual however developing infarct or HSV encephalitis could have this appearance. MRI is recommended. Critical Value/emergent results were called by telephone at the time of interpretation on 06/28/2024 at 5:42 pm to provider ANDREW TEE , who verbally acknowledged  these results. Electronically Signed   By: Norman Gatlin M.D.   On: 06/28/2024 17:53   DG Chest Port 1 View Result Date: 06/28/2024 CLINICAL DATA:  68 year old male with possible sepsis. EXAM: PORTABLE CHEST 1 VIEW COMPARISON:  Chest radiographs 11/17/2021 and earlier. FINDINGS: Portable AP semi upright view at 1721 hours. Considerably lower lung volumes, accentuated cardiac and mediastinal contours. Visualized tracheal air column is within normal limits. Allowing for portable technique the lungs are clear. No pneumothorax or pleural effusion. Negative visible bowel gas. No acute osseous abnormality identified. IMPRESSION: Low lung volumes, otherwise no acute cardiopulmonary abnormality. Electronically Signed   By: VEAR Hurst M.D.   On: 06/28/2024 17:29        Scheduled Meds:  amLODipine   10 mg Oral Daily   folic acid   1 mg Oral Daily   Continuous Infusions:  lactated ringers  150 mL/hr at 06/29/24 1847     LOS:  2 days    Time spent: 52 minutes    Renato Applebaum, MD [facility]

## 2024-07-01 DIAGNOSIS — N179 Acute kidney failure, unspecified: Secondary | ICD-10-CM | POA: Diagnosis not present

## 2024-07-01 LAB — CBC WITH DIFFERENTIAL/PLATELET
Abs Immature Granulocytes: 0.1 K/uL — ABNORMAL HIGH (ref 0.00–0.07)
Basophils Absolute: 0 K/uL (ref 0.0–0.1)
Basophils Relative: 0 %
Eosinophils Absolute: 0 K/uL (ref 0.0–0.5)
Eosinophils Relative: 0 %
HCT: 28.2 % — ABNORMAL LOW (ref 39.0–52.0)
Hemoglobin: 9.9 g/dL — ABNORMAL LOW (ref 13.0–17.0)
Immature Granulocytes: 1 %
Lymphocytes Relative: 7 %
Lymphs Abs: 0.8 K/uL (ref 0.7–4.0)
MCH: 35.9 pg — ABNORMAL HIGH (ref 26.0–34.0)
MCHC: 35.1 g/dL (ref 30.0–36.0)
MCV: 102.2 fL — ABNORMAL HIGH (ref 80.0–100.0)
Monocytes Absolute: 1 K/uL (ref 0.1–1.0)
Monocytes Relative: 9 %
Neutro Abs: 9.2 K/uL — ABNORMAL HIGH (ref 1.7–7.7)
Neutrophils Relative %: 83 %
Platelets: 82 K/uL — ABNORMAL LOW (ref 150–400)
RBC: 2.76 MIL/uL — ABNORMAL LOW (ref 4.22–5.81)
RDW: 13.5 % (ref 11.5–15.5)
WBC: 11.2 K/uL — ABNORMAL HIGH (ref 4.0–10.5)
nRBC: 0 % (ref 0.0–0.2)

## 2024-07-01 LAB — COMPREHENSIVE METABOLIC PANEL WITH GFR
ALT: 46 U/L — ABNORMAL HIGH (ref 0–44)
AST: 85 U/L — ABNORMAL HIGH (ref 15–41)
Albumin: 3.2 g/dL — ABNORMAL LOW (ref 3.5–5.0)
Alkaline Phosphatase: 53 U/L (ref 38–126)
Anion gap: 16 — ABNORMAL HIGH (ref 5–15)
BUN: 12 mg/dL (ref 8–23)
CO2: 19 mmol/L — ABNORMAL LOW (ref 22–32)
Calcium: 8.5 mg/dL — ABNORMAL LOW (ref 8.9–10.3)
Chloride: 101 mmol/L (ref 98–111)
Creatinine, Ser: 1.06 mg/dL (ref 0.61–1.24)
GFR, Estimated: >60 mL/min (ref >60)
Glucose, Bld: 85 mg/dL (ref 70–99)
Potassium: 3.2 mmol/L — ABNORMAL LOW (ref 3.5–5.1)
Sodium: 136 mmol/L (ref 135–145)
Total Bilirubin: 1 mg/dL (ref 0.0–1.2)
Total Protein: 5.7 g/dL — ABNORMAL LOW (ref 6.5–8.1)

## 2024-07-01 LAB — PHOSPHORUS: Phosphorus: 3 mg/dL (ref 2.5–4.6)

## 2024-07-01 LAB — MAGNESIUM: Magnesium: 1.6 mg/dL — ABNORMAL LOW (ref 1.7–2.4)

## 2024-07-01 LAB — CK: Total CK: 2537 U/L — ABNORMAL HIGH (ref 49–397)

## 2024-07-01 MED ORDER — FOLIC ACID 1 MG PO TABS: 1.0000 mg | ORAL_TABLET | Freq: Every day | ORAL | 0 refills | Status: AC

## 2024-07-01 MED ORDER — MAGNESIUM SULFATE 2 GM/50ML IV SOLN
2.0000 g | Freq: Once | INTRAVENOUS | Status: AC
  Administered 2024-07-01: 2 g via INTRAVENOUS
  Filled 2024-07-01: qty 50

## 2024-07-01 MED ORDER — POTASSIUM CHLORIDE CRYS ER 20 MEQ PO TBCR
40.0000 meq | EXTENDED_RELEASE_TABLET | Freq: Two times a day (BID) | ORAL | Status: DC
  Administered 2024-07-01: 40 meq via ORAL
  Filled 2024-07-01: qty 2

## 2024-07-01 MED ORDER — MELATONIN 3 MG PO TABS
3.0000 mg | ORAL_TABLET | Freq: Every evening | ORAL | Status: DC | PRN
  Filled 2024-07-01: qty 1

## 2024-07-01 MED ORDER — LABETALOL HCL 5 MG/ML IV SOLN
20.0000 mg | INTRAVENOUS | Status: DC | PRN
  Administered 2024-07-01: 20 mg via INTRAVENOUS
  Filled 2024-07-01: qty 4

## 2024-07-01 MED ORDER — POTASSIUM CHLORIDE CRYS ER 20 MEQ PO TBCR: 20.0000 meq | EXTENDED_RELEASE_TABLET | Freq: Every day | ORAL | 0 refills | Status: AC

## 2024-07-01 MED ORDER — MAGNESIUM 400 MG PO TABS: 400.0000 mg | ORAL_TABLET | Freq: Two times a day (BID) | ORAL | 0 refills | Status: AC

## 2024-07-01 MED ORDER — METRONIDAZOLE 500 MG PO TABS: 500.0000 mg | ORAL_TABLET | Freq: Two times a day (BID) | ORAL | 0 refills | Status: AC

## 2024-07-01 NOTE — Discharge Summary (Signed)
 Physician Discharge Summary  Cameron [name] FMW:[id number] DOB: [birth year] DOA: 06/28/2024  PCP: [facility] [address]  Admit date: 06/28/2024 Discharge date: 07/01/2024  Admitted From: Home Disposition: Home  Recommendations for Outpatient Follow-up:  Follow up with PCP in 1-2 weeks Please obtain BMP/CBC/magnesium  in one week Please follow up on the following pending results:  Home Health: N/A Equipment/Devices: N/A  Discharge Condition: Stable CODE STATUS: Full code Diet recommendation: Low-salt diet, adequate hydration  Discharge summary: 68 year old gentleman with history of hypertension on amlodipine  brought to emergency room by EMS, he was found unresponsive, car door open parked in his driveway.  According to the patient, he reached home, opened the door of the car to get out but did not remember what happened after that.  Neighbor called EMS.  Patient stated he passed out.  There was suspicion of seizure-like activities.  In the emergency room, temperature 102, creatinine 2.3, CPK 278, lactic acid 2.4.  MRI brain was negative.  Treated with IV fluids, cooling and empiric antibiotics.  Admitted for AKI, rhabdomyolysis.  Patient reported ongoing diarrhea.  Stool exam consistent with enteropathogenic E. coli and Giardia. Clinically improving now.   Assessment & Plan:   Acute encephalopathy secondary to heatstroke: Suspect seizure-like activity however no seizures on clinical exam.. Mental status improved.  MRI negative.  EEG normal.  Temperature is normalized.  No indication for seizure medications.  This was likely environmental cause.  Instructions given to protect from heatstroke, maintaining enough fluid intake, adequate hydration.   AKI, rhabdomyolysis: CPK elevated.  Treated with fluid.  Renal functions normalized.  CPK downtrending.  Urine output is adequate and clear.   CK levels 2500, downtrending from 4450.  Creatinine is normal.  He will hydrate well as instructed at home.      Acute gastroenteritis: Stool positive for E. coli and Giardia.  Will treat for Giardia with metronidazole  500 mg twice daily for 7 days. Clinically improving.   Macrocytosis: B12 normal.  Folic acid  less than 5.  Given folic acid  supplement.  Hypokalemia and hypomagnesemia: Replaced before discharge.  Sent home with oral supplementation.  Will need recheck in 1 week.  Patient has adequately clinically improved.  He is able to go home today.  Discharge Diagnoses:  Principal Problem:   AKI (acute kidney injury) [facility])    Discharge Instructions  Discharge Instructions     Diet - low sodium heart healthy   Complete by: As directed    Drink plenty of water  and keep yourself hydrated   Increase activity slowly   Complete by: As directed       Allergies as of 07/01/2024       Reactions   Penicillins Shortness Of Breath, Swelling   Has patient had a PCN reaction causing immediate rash, facial/tongue/throat swelling, SOB or lightheadedness with hypotension: yes Has patient had a PCN reaction causing severe rash involving mucus membranes or skin necrosis: no Has patient had a PCN reaction that required hospitalization: yes,patient  was admitted   Did a PCN reaction occurring within the last 10 years: no If all of the above answers are NO, then may proceed with Cephalosporin use.        Medication List     TAKE these medications    amLODipine  10 MG tablet Commonly known as: NORVASC  Take 1 tablet (10 mg total) by mouth daily.   folic acid  1 MG tablet Commonly known as: FOLVITE  Take 1 tablet (1 mg total) by mouth daily.   ibuprofen  200  MG tablet Commonly known as: ADVIL  Take 200 mg by mouth every 6 (six) hours as needed for headache or mild pain (pain score 1-3).   Magnesium  400 MG Tabs Take 400 mg by mouth 2 (two) times daily for 7 days.   metroNIDAZOLE  500 MG tablet Commonly known as: FLAGYL  Take 1 tablet (500 mg total) by mouth every 12 (twelve) hours for 6  days.   potassium chloride  SA 20 MEQ tablet Commonly known as: KLOR-CON  M Take 1 tablet (20 mEq total) by mouth daily for 7 days.        Follow-up Information     [facility], [address] Follow up.   Contact information: [address] [place] [address] [phone]                Allergies  Allergen Reactions   Penicillins Shortness Of Breath and Swelling    Has patient had a PCN reaction causing immediate rash, facial/tongue/throat swelling, SOB or lightheadedness with hypotension: yes Has patient had a PCN reaction causing severe rash involving mucus membranes or skin necrosis: no Has patient had a PCN reaction that required hospitalization: yes,patient  was admitted   Did a PCN reaction occurring within the last 10 years: no If all of the above answers are NO, then may proceed with Cephalosporin use.     Consultations: None   Procedures/Studies: ECHOCARDIOGRAM COMPLETE Result Date: 06/29/2024    ECHOCARDIOGRAM REPORT   Patient Name:   Cameron [name] Date of Exam: 06/29/2024 Medical Rec #:  [id number]     Height:       73.0 in Accession #:    [id number]    Weight:       167.1 lb Date of Birth:  [birth year]      BSA:          1.993 m Patient Age:    68 years      BP:           169/150 mmHg Patient Gender: M             HR:           83 bpm. Exam Location:  Inpatient Procedure: 2D Echo, Cardiac Doppler and Color Doppler (Both Spectral and Color            Flow Doppler were utilized during procedure). Indications:    Syncope R55  History:        Patient has no prior history of Echocardiogram examinations.                 Risk Factors:Hypertension.  Sonographer:    Thea Norlander RCS Referring Phys: CAMILA DELENA NED  Sonographer Comments: Image acquisition challenging due to patient body habitus. IMPRESSIONS  1. Left ventricular ejection fraction, by estimation, is 55 to 60%. The left ventricle has normal function. The left ventricle has no regional wall motion abnormalities.  Left ventricular diastolic parameters were normal.  2. Right ventricular systolic function is normal. The right ventricular size is mildly enlarged.  3. The mitral valve is grossly normal. No evidence of mitral valve regurgitation. No evidence of mitral stenosis.  4. The aortic valve is tricuspid. Aortic valve regurgitation is not visualized. No aortic stenosis is present.  5. Aortic dilatation noted. There is dilatation of the aortic root, measuring 40 mm. There is dilatation of the ascending aorta, measuring 39 mm.  6. The inferior vena cava is normal in size with <50% respiratory variability, suggesting right atrial pressure of 8 mmHg.  Comparison(s): No prior Echocardiogram. FINDINGS  Left Ventricle: Left ventricular ejection fraction, by estimation, is 55 to 60%. The left ventricle has normal function. The left ventricle has no regional wall motion abnormalities. The left ventricular internal cavity size was normal in size. There is  no left ventricular hypertrophy. Left ventricular diastolic parameters were normal. Right Ventricle: The right ventricular size is mildly enlarged. No increase in right ventricular wall thickness. Right ventricular systolic function is normal. Left Atrium: Left atrial size was normal in size. Right Atrium: Right atrial size was normal in size. Pericardium: There is no evidence of pericardial effusion. Mitral Valve: The mitral valve is grossly normal. No evidence of mitral valve regurgitation. No evidence of mitral valve stenosis. Tricuspid Valve: The tricuspid valve is normal in structure. Tricuspid valve regurgitation is not demonstrated. No evidence of tricuspid stenosis. Aortic Valve: The aortic valve is tricuspid. Aortic valve regurgitation is not visualized. No aortic stenosis is present. Aortic valve peak gradient measures 7.6 mmHg. Pulmonic Valve: The pulmonic valve was not well visualized. Pulmonic valve regurgitation is not visualized. No evidence of pulmonic stenosis.  Aorta: Aortic dilatation noted. There is dilatation of the aortic root, measuring 40 mm. There is dilatation of the ascending aorta, measuring 39 mm. Venous: The inferior vena cava is normal in size with less than 50% respiratory variability, suggesting right atrial pressure of 8 mmHg. IAS/Shunts: The atrial septum is grossly normal.  LEFT VENTRICLE PLAX 2D LVIDd:         4.80 cm   Diastology LVIDs:         3.50 cm   LV e' medial:    6.85 cm/s LV PW:         1.10 cm   LV E/e' medial:  8.3 LV IVS:        0.90 cm   LV e' lateral:   7.72 cm/s LVOT diam:     2.40 cm   LV E/e' lateral: 7.4 LV SV:         89 LV SV Index:   45 LVOT Area:     4.52 cm  RIGHT VENTRICLE             IVC RV S prime:     13.40 cm/s  IVC diam: 1.70 cm TAPSE (M-mode): 3.1 cm LEFT ATRIUM             Index        RIGHT ATRIUM           Index LA diam:        3.50 cm 1.76 cm/m   RA Area:     19.20 cm LA Vol (A2C):   38.7 ml 19.41 ml/m  RA Volume:   54.00 ml  27.09 ml/m LA Vol (A4C):   26.9 ml 13.49 ml/m LA Biplane Vol: 32.3 ml 16.20 ml/m  AORTIC VALVE AV Area (Vmax): 3.41 cm AV Vmax:        138.00 cm/s AV Peak Grad:   7.6 mmHg LVOT Vmax:      104.00 cm/s LVOT Vmean:     66.000 cm/s LVOT VTI:       0.197 m  AORTA Ao Root diam: 4.00 cm Ao Asc diam:  3.90 cm MITRAL VALVE MV Area (PHT): 2.99 cm    SHUNTS MV Decel Time: 254 msec    Systemic VTI:  0.20 m MV E velocity: 57.10 cm/s  Systemic Diam: 2.40 cm MV A velocity: 86.50 cm/s MV E/A ratio:  0.66 Sunit Tolia  Electronically signed by Madonna Large Signature Date/Time: 06/29/2024/6:51:38 PM    Final    EEG adult Result Date: 06/29/2024 Shelton Arlin KIDD, MD     06/29/2024 10:38 AM Patient Name: NOBORU BIDINGER MRN: [id number] Epilepsy Attending: Arlin KIDD Shelton Referring Physician/Provider: Debby Camila LABOR, MD Date: 06/29/2024 Duration: 21.53 mins Patient history: 68yo M with ams. EEG to evaluate for seizure Level of alertness: Awake AEDs during EEG study: None Technical aspects: This EEG study was  done with scalp electrodes positioned according to the 10-20 International system of electrode placement. Electrical activity was reviewed with band pass filter of 1-70Hz , sensitivity of 7 uV/mm, display speed of 29mm/sec with a 60Hz  notched filter applied as appropriate. EEG data were recorded continuously and digitally stored.  Video monitoring was available and reviewed as appropriate. Description: The posterior dominant rhythm consists of 8-9 Hz activity of moderate voltage (25-35 uV) seen predominantly in posterior head regions, symmetric and reactive to eye opening and eye closing. Hyperventilation and photic stimulation were not performed.   EEG was technically difficult due to significant myogenic artifact. IMPRESSION: This study is within normal limits. No seizures or epileptiform discharges were seen throughout the recording. A normal interictal EEG does not exclude the diagnosis of epilepsy. Arlin KIDD Shelton   MR BRAIN WO CONTRAST Result Date: 06/28/2024 CLINICAL DATA:  Mental status change, unknown cause EXAM: MRI HEAD WITHOUT CONTRAST TECHNIQUE: Multiplanar, multiecho pulse sequences of the brain and surrounding structures were obtained without intravenous contrast. COMPARISON:  CT head from earlier today. FINDINGS: Motion limited incomplete exam. SWI imaging not performed. Within that limitation: Brain: No acute infarction, hemorrhage, hydrocephalus, extra-axial collection or mass lesion. Mild scattered T2/FLAIR hyperintensities within the white matter, nonspecific but compatible with chronic microvascular ischemic disease. Vascular: Major arterial flow voids are maintained at the skull base. Skull and upper cervical spine: Normal marrow signal. Sinuses/Orbits: Clear sinuses.  No acute orbital findings. Other: No mastoid effusions IMPRESSION: No evidence of acute intracranial abnormality on motion limited assessment. Electronically Signed   By: Gilmore GORMAN Molt M.D.   On: 06/28/2024 22:20   CT  ANGIO HEAD NECK W WO CM Result Date: 06/28/2024 EXAM: CTA HEAD AND NECK WITH AND WITHOUT 06/28/2024 06:50:39 PM TECHNIQUE: CTA of the head and neck was performed with and without the administration of intravenous contrast. Multiplanar 2D and/or 3D reformatted images are provided for review. Automated exposure control, iterative reconstruction, and/or weight based adjustment of the mA/kV was utilized to reduce the radiation dose to as low as reasonably achievable. Stenosis of the internal carotid arteries measured using NASCET criteria. COMPARISON: Head CT 06/28/2024 CLINICAL HISTORY: Neuro deficit, acute, stroke suspected. FINDINGS: CTA NECK: AORTIC ARCH AND ARCH VESSELS: Atherosclerotic calcifications of the aortic arch and arch vessel origins, which remain patent. No dissection or arterial injury. No significant stenosis of the brachiocephalic or subclavian arteries. CERVICAL CAROTID ARTERIES: Mild calcified plaque along the bilateral carotid bulbs and proximal cervical ICAs without hemodynamically significant stenosis. No dissection or arterial injury. 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 No dissection or arterial injury. LUNGS AND MEDIASTINUM: Unremarkable. SOFT TISSUES: No acute abnormality. BONES: No acute abnormality. CTA HEAD: ANTERIOR CIRCULATION: No significant stenosis of the internal carotid arteries. No significant stenosis of the anterior cerebral arteries. No significant stenosis of the middle cerebral arteries. No aneurysm. POSTERIOR CIRCULATION: Atherosclerotic calcifications of the ICA siphons without significant stenosis or aneurysm. No significant stenosis of the posterior cerebral  arteries. No significant stenosis of the basilar artery. No aneurysm. OTHER: No dural venous sinus thrombosis on this non-dedicated study. IMPRESSION: 1. No large vessel occlusion in the head or neck vessels. 2. 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 Code stroke results were communicated to Dr. Lindzen at 7:03pm on 06/28/2024 by secure text paging. Electronically signed by: Ryan Chess MD 06/28/2024 07:05 PM EDT RP Workstation: [id number]   CT Head Wo Contrast Result Date: 06/28/2024 CLINICAL DATA:  Headache, increasing frequency or severity EXAM: CT HEAD WITHOUT CONTRAST TECHNIQUE: Contiguous axial images were obtained from the base of the skull through the vertex without intravenous contrast. RADIATION DOSE REDUCTION: This exam was performed according to the departmental dose-optimization program which includes automated exposure control, adjustment of the mA and/or kV according to patient size and/or use of iterative reconstruction technique. COMPARISON:  CT head 11/17/2021 FINDINGS: Brain: Vague hypoattenuation in the right temporal lobe compared to the left (circa series 2/image 10). No intracranial hemorrhage or mass effect. No hydrocephalus. No extra-axial fluid collection. Mild cerebral atrophy. Age advanced chronic small vessel ischemic disease. Vascular: No hyperdense vessel. Intracranial arterial calcification. Skull: No fracture or focal lesion. Sinuses/Orbits: No acute finding. Other: None. IMPRESSION: Vague hypoattenuation in the right temporal lobe compared to the left. This is favored artifactual however developing infarct or HSV encephalitis could have this appearance. MRI is recommended. Critical Value/emergent results were called by telephone at the time of interpretation on 06/28/2024 at 5:42 pm to provider ANDREW TEE , who verbally acknowledged these results. Electronically Signed   By: Norman Gatlin M.D.   On: 06/28/2024 17:53   DG Chest Port 1 View Result Date: 06/28/2024 CLINICAL DATA:  68 year old male with possible sepsis. EXAM: PORTABLE CHEST 1 VIEW COMPARISON:  Chest radiographs 11/17/2021 and earlier. FINDINGS: Portable AP semi upright view at 1721 hours. Considerably  lower lung volumes, accentuated cardiac and mediastinal contours. Visualized tracheal air column is within normal limits. Allowing for portable technique the lungs are clear. No pneumothorax or pleural effusion. Negative visible bowel gas. No acute osseous abnormality identified. IMPRESSION: Low lung volumes, otherwise no acute cardiopulmonary abnormality. Electronically Signed   By: VEAR Hurst M.D.   On: 06/28/2024 17:29   (Echo, Carotid, EGD, Colonoscopy, ERCP)    Subjective: Patient was seen and examined.  Denies any complaints.  He tells me he is eating and drinking well but is still going to bathroom about 3 times since last 24 hours.  He has small volume loose stool.  Denies any abdominal pain or cramping.  Wants to go home.   Discharge Exam: Vitals:   07/01/24 0720 07/01/24 0900  BP: (!) 180/93 (!) 166/68  Pulse: (!) 55 69  Resp: 18   Temp: 97.7 F (36.5 C)   SpO2: 95%    Vitals:   07/01/24 0334 07/01/24 0420 07/01/24 0720 07/01/24 0900  BP:  (!) 181/95 (!) 180/93 (!) 166/68  Pulse:  80 (!) 55 69  Resp:  20 18   Temp:  97.7 F (36.5 C) 97.7 F (36.5 C)   TempSrc:  Oral Oral   SpO2:  96% 95%   Weight: 71.6 kg     Height:        General: Pt is alert, awake, not in acute distress Cardiovascular: RRR, S1/S2 +, no rubs, no gallops Respiratory: CTA bilaterally, no wheezing, no rhonchi Abdominal: Soft, NT, ND, bowel sounds + Extremities: no edema, no cyanosis  The results of significant diagnostics from this hospitalization (including imaging, microbiology, ancillary and laboratory) are listed below for reference.     Microbiology: Recent Results (from the past 240 hours)  Resp panel by RT-PCR (RSV, Flu A&B, Covid) Anterior Nasal Swab     Status: None   Collection Time: 06/28/24  5:14 PM   Specimen: Anterior Nasal Swab  Result Value Ref Range Status   SARS Coronavirus 2 by RT PCR NEGATIVE NEGATIVE Final   Influenza A by PCR NEGATIVE NEGATIVE Final   Influenza B by  PCR NEGATIVE NEGATIVE Final    Comment: (NOTE) The Xpert Xpress SARS-CoV-2/FLU/RSV plus assay is intended as an aid in the diagnosis of influenza from Nasopharyngeal swab specimens and should not be used as a sole basis for treatment. Nasal washings and aspirates are unacceptable for Xpert Xpress SARS-CoV-2/FLU/RSV testing.  Fact Sheet for Patients: BloggerCourse.com  Fact Sheet for Healthcare Providers: SeriousBroker.it  This test is not yet approved or cleared by the [place]  FDA and has been authorized for detection and/or diagnosis of SARS-CoV-2 by FDA under an Emergency Use Authorization (EUA). This EUA will remain in effect (meaning this test can be used) for the duration of the COVID-19 declaration under Section 564(b)(1) of the Act, 21 U.S.C. section 360bbb-3(b)(1), unless the authorization is terminated or revoked.     Resp Syncytial Virus by PCR NEGATIVE NEGATIVE Final    Comment: (NOTE) Fact Sheet for Patients: BloggerCourse.com  Fact Sheet for Healthcare Providers: SeriousBroker.it  This test is not yet approved or cleared by the [place]  FDA and has been authorized for detection and/or diagnosis of SARS-CoV-2 by FDA under an Emergency Use Authorization (EUA). This EUA will remain in effect (meaning this test can be used) for the duration of the COVID-19 declaration under Section 564(b)(1) of the Act, 21 U.S.C. section 360bbb-3(b)(1), unless the authorization is terminated or revoked.  Performed at [facility], [address] [place], [place] [address]   Blood Culture (routine x 2)     Status: None (Preliminary result)   Collection Time: 06/28/24  5:18 PM   Specimen: BLOOD LEFT HAND  Result Value Ref Range Status   Specimen Description BLOOD LEFT HAND  Final   Special Requests   Final    BOTTLES DRAWN AEROBIC AND ANAEROBIC Blood Culture  adequate volume   Culture   Final    NO GROWTH 3 DAYS Performed at [facility], [address], [place], [place] [address]    Report Status PENDING  Incomplete  Gastrointestinal Panel by PCR , Stool     Status: Abnormal   Collection Time: 06/29/24  3:54 AM   Specimen: Stool  Result Value Ref Range Status   Campylobacter species NOT DETECTED NOT DETECTED Final   Plesimonas shigelloides NOT DETECTED NOT DETECTED Final   Salmonella species NOT DETECTED NOT DETECTED Final   Yersinia enterocolitica NOT DETECTED NOT DETECTED Final   Vibrio species NOT DETECTED NOT DETECTED Final   Vibrio cholerae NOT DETECTED NOT DETECTED Final   Enteroaggregative E coli (EAEC) NOT DETECTED NOT DETECTED Final   Enteropathogenic E coli (EPEC) DETECTED (A) NOT DETECTED Final    Comment: RESULT CALLED TO, READ BACK BY AND VERIFIED WITH: JAMA J., RN AT 9249 06/30/24 RAM    Enterotoxigenic E coli (ETEC) NOT DETECTED NOT DETECTED Final   Shiga like toxin producing E coli (STEC) NOT DETECTED NOT DETECTED Final   Shigella/Enteroinvasive E coli (EIEC) NOT DETECTED NOT DETECTED Final  Cryptosporidium NOT DETECTED NOT DETECTED Final   Cyclospora cayetanensis NOT DETECTED NOT DETECTED Final   Entamoeba histolytica NOT DETECTED NOT DETECTED Final   Giardia lamblia DETECTED (A) NOT DETECTED Final    Comment: RESULT CALLED TO, READ BACK BY AND VERIFIED WITH: JAMA J, RN AT 0750 06/30/24 RAM    Adenovirus F40/41 NOT DETECTED NOT DETECTED Final   Astrovirus NOT DETECTED NOT DETECTED Final   Norovirus GI/GII NOT DETECTED NOT DETECTED Final   Rotavirus A NOT DETECTED NOT DETECTED Final   Sapovirus (I, II, IV, and V) NOT DETECTED NOT DETECTED Final    Comment: Performed at [facility], [address] [place], [place] [address]     Labs: BNP (last 3 results) No results for input(s): BNP in the last 8760 hours. Basic Metabolic Panel: Recent Labs  Lab 06/28/24 1705 06/28/24 1734 06/29/24 0753  06/30/24 0308 07/01/24 0258  NA 142 144 138 140 136  K 2.9* 3.0* 3.8 3.8 3.2*  CL 117*  --  107 108 101  CO2 19*  --  14* 20* 19*  GLUCOSE 109*  --  62* 85 85  BUN 23  --  25* 19 12  CREATININE 2.31*  --  2.11* 1.60* 1.06  CALCIUM 8.4*  --  8.6* 8.9 8.5*  MG  --   --   --   --  1.6*  PHOS  --   --   --   --  3.0   Liver Function Tests: Recent Labs  Lab 06/28/24 1705 06/30/24 0308 07/01/24 0258  AST 26 118* 85*  ALT 15 51* 46*  ALKPHOS 49 60 53  BILITOT 0.5 1.0 1.0  PROT 5.9* 6.2* 5.7*  ALBUMIN 3.4* 3.6 3.2*   No results for input(s): LIPASE, AMYLASE in the last 168 hours. No results for input(s): AMMONIA in the last 168 hours. CBC: Recent Labs  Lab 06/28/24 1705 06/28/24 1734 06/29/24 0753 06/30/24 0308 07/01/24 0647  WBC 9.6  --  14.2* 11.6* 11.2*  NEUTROABS 8.3*  --   --  9.7* 9.2*  HGB 10.3* 9.9* 11.1* 10.7* 9.9*  HCT 30.0* 29.0* 33.6* 31.5* 28.2*  MCV 107.1*  --  110.5* 106.1* 102.2*  PLT 102*  --  100* 91* 82*   Cardiac Enzymes: Recent Labs  Lab 06/28/24 1705 06/30/24 0308 06/30/24 1728 07/01/24 0258  CKTOTAL 278 4,450* 3,081* 2,537*   BNP: Invalid input(s): POCBNP CBG: No results for input(s): GLUCAP in the last 168 hours. D-Dimer No results for input(s): DDIMER in the last 72 hours. Hgb A1c No results for input(s): HGBA1C in the last 72 hours. Lipid Profile No results for input(s): CHOL, HDL, LDLCALC, TRIG, CHOLHDL, LDLDIRECT in the last 72 hours. Thyroid function studies No results for input(s): TSH, T4TOTAL, T3FREE, THYROIDAB in the last 72 hours.  Invalid input(s): FREET3 Anemia work up Recent Labs    06/30/24 0308  VITAMINB12 276  FOLATE 4.6*  FERRITIN 361*  TIBC 315  IRON 109  RETICCTPCT 2.5   Urinalysis    Component Value Date/Time   COLORURINE YELLOW 06/28/2024 1705   APPEARANCEUR HAZY (A) 06/28/2024 1705   LABSPEC 1.013 06/28/2024 1705   PHURINE 5.0 06/28/2024 1705   GLUCOSEU  NEGATIVE 06/28/2024 1705   HGBUR MODERATE (A) 06/28/2024 1705   BILIRUBINUR NEGATIVE 06/28/2024 1705   KETONESUR NEGATIVE 06/28/2024 1705   PROTEINUR 30 (A) 06/28/2024 1705   UROBILINOGEN 0.2 08/07/2014 2225   NITRITE NEGATIVE 06/28/2024 1705   LEUKOCYTESUR NEGATIVE 06/28/2024 1705  Sepsis Labs Recent Labs  Lab 06/28/24 1705 06/29/24 0753 06/30/24 0308 07/01/24 0647  WBC 9.6 14.2* 11.6* 11.2*   Microbiology Recent Results (from the past 240 hours)  Resp panel by RT-PCR (RSV, Flu A&B, Covid) Anterior Nasal Swab     Status: None   Collection Time: 06/28/24  5:14 PM   Specimen: Anterior Nasal Swab  Result Value Ref Range Status   SARS Coronavirus 2 by RT PCR NEGATIVE NEGATIVE Final   Influenza A by PCR NEGATIVE NEGATIVE Final   Influenza B by PCR NEGATIVE NEGATIVE Final    Comment: (NOTE) The Xpert Xpress SARS-CoV-2/FLU/RSV plus assay is intended as an aid in the diagnosis of influenza from Nasopharyngeal swab specimens and should not be used as a sole basis for treatment. Nasal washings and aspirates are unacceptable for Xpert Xpress SARS-CoV-2/FLU/RSV testing.  Fact Sheet for Patients: BloggerCourse.com  Fact Sheet for Healthcare Providers: SeriousBroker.it  This test is not yet approved or cleared by the [place]  FDA and has been authorized for detection and/or diagnosis of SARS-CoV-2 by FDA under an Emergency Use Authorization (EUA). This EUA will remain in effect (meaning this test can be used) for the duration of the COVID-19 declaration under Section 564(b)(1) of the Act, 21 U.S.C. section 360bbb-3(b)(1), unless the authorization is terminated or revoked.     Resp Syncytial Virus by PCR NEGATIVE NEGATIVE Final    Comment: (NOTE) Fact Sheet for Patients: BloggerCourse.com  Fact Sheet for Healthcare Providers: SeriousBroker.it  This test is not yet  approved or cleared by the [place]  FDA and has been authorized for detection and/or diagnosis of SARS-CoV-2 by FDA under an Emergency Use Authorization (EUA). This EUA will remain in effect (meaning this test can be used) for the duration of the COVID-19 declaration under Section 564(b)(1) of the Act, 21 U.S.C. section 360bbb-3(b)(1), unless the authorization is terminated or revoked.  Performed at [facility], [address], [place], [place] [address]   Blood Culture (routine x 2)     Status: None (Preliminary result)   Collection Time: 06/28/24  5:18 PM   Specimen: BLOOD LEFT HAND  Result Value Ref Range Status   Specimen Description BLOOD LEFT HAND  Final   Special Requests   Final    BOTTLES DRAWN AEROBIC AND ANAEROBIC Blood Culture adequate volume   Culture   Final    NO GROWTH 3 DAYS Performed at [facility], [address], [place], [place] [address]    Report Status PENDING  Incomplete  Gastrointestinal Panel by PCR , Stool     Status: Abnormal   Collection Time: 06/29/24  3:54 AM   Specimen: Stool  Result Value Ref Range Status   Campylobacter species NOT DETECTED NOT DETECTED Final   Plesimonas shigelloides NOT DETECTED NOT DETECTED Final   Salmonella species NOT DETECTED NOT DETECTED Final   Yersinia enterocolitica NOT DETECTED NOT DETECTED Final   Vibrio species NOT DETECTED NOT DETECTED Final   Vibrio cholerae NOT DETECTED NOT DETECTED Final   Enteroaggregative E coli (EAEC) NOT DETECTED NOT DETECTED Final   Enteropathogenic E coli (EPEC) DETECTED (A) NOT DETECTED Final    Comment: RESULT CALLED TO, READ BACK BY AND VERIFIED WITH: JAMA J., RN AT 9249 06/30/24 RAM    Enterotoxigenic E coli (ETEC) NOT DETECTED NOT DETECTED Final   Shiga like toxin producing E coli (STEC) NOT DETECTED NOT DETECTED Final   Shigella/Enteroinvasive E coli (EIEC) NOT DETECTED NOT DETECTED Final  Cryptosporidium NOT DETECTED NOT DETECTED Final   Cyclospora  cayetanensis NOT DETECTED NOT DETECTED Final   Entamoeba histolytica NOT DETECTED NOT DETECTED Final   Giardia lamblia DETECTED (A) NOT DETECTED Final    Comment: RESULT CALLED TO, READ BACK BY AND VERIFIED WITH: JAMA J, RN AT 0750 06/30/24 RAM    Adenovirus F40/41 NOT DETECTED NOT DETECTED Final   Astrovirus NOT DETECTED NOT DETECTED Final   Norovirus GI/GII NOT DETECTED NOT DETECTED Final   Rotavirus A NOT DETECTED NOT DETECTED Final   Sapovirus (I, II, IV, and V) NOT DETECTED NOT DETECTED Final    Comment: Performed at [facility], [address] [place], [place] [address]     Time coordinating discharge: 35 minutes  SIGNED:   Renato Applebaum, MD  [facility] 07/01/2024, 9:13 AM

## 2024-07-01 NOTE — TOC Transition Note (Addendum)
 Transition of Care [facility]) - Discharge Note   Patient Details  Name: Cameron [name] MRN: [id number] Date of Birth: [birth year]  Transition of [facility]) CM/SW Contact:  Waddell Barnie Rama, RN Phone Number: 07/01/2024, 9:33 AM   Clinical Narrative:    For dc today, has no needs. He has a friend who is picking him up today.  Trying to make follow up apt, [facility] states they do not take his insurance. Representative at [facility] states they will call this NCM back after they check into this. NCM will set up follow up with a [facility].  NCM got him a follow up at the [facility] on AVS.         Patient Goals and CMS Choice            Discharge Placement                       Discharge Plan and Services Additional resources added to the After Visit Summary for                                       Social Drivers of Health (SDOH) Interventions SDOH Screenings   Food Insecurity: No Food Insecurity (06/30/2024)  Housing: Low Risk  (06/30/2024)  Transportation Needs: No Transportation Needs (06/30/2024)  Utilities: Not At Risk (06/30/2024)  Depression (PHQ2-9): Medium Risk (04/15/2019)  Social Connections: Patient Declined (06/30/2024)  Tobacco Use: High Risk (06/29/2024)     Readmission Risk Interventions    06/30/2024   11:09 AM  Readmission Risk Prevention Plan  Medication Screening Complete  Transportation Screening Complete

## 2024-07-01 NOTE — Progress Notes (Signed)
 CM working on setting pt up with a PCP--- [facility] does not take his health insurance per the office and pt has not ever established a PCP there.  Pt getting dressed at this time to go home and has contacted his ride. Will print AVS after CM has arranged new PCP.  Tracker Mance,RN SWOT

## 2024-07-01 NOTE — Progress Notes (Signed)
 [facility] night cross cover note:   I was notified by RN of the patient's request for a sleep aid. I subsequently placed order for prn melatonin for insomnia.     Newton Pigg, DO Hospitalist

## 2024-07-01 NOTE — Progress Notes (Signed)
  New PCP that takes his insurance has been arranged by CM RN, pt informed of new PCP and appointment time listed on his AVS.  Discharge instructions reviewed with pt. Pt informed his scripts were sent to his pharmacy for pick up. Pt instructed not to take ibuprofen  due to his kidney injury due to his dehyration, advised to take tylenol  for pain prn.  Copy of instructions given to pt.  Pt will be d/c'd via wheelchair with belongings and will be escorted by staff. Pt's ride on their way and will be here in next 15 minutes.   Jorrell Kuster,RN SWOT

## 2024-07-03 LAB — CULTURE, BLOOD (ROUTINE X 2)
Culture: NO GROWTH
Special Requests: ADEQUATE

## 2024-07-28 ENCOUNTER — Ambulatory Visit: Admitting: Physician Assistant

## 2024-07-28 NOTE — Progress Notes (Deleted)
 New patient visit   Patient: Cameron [name]   DOB: 12/22/1955   67 y.o. Male  MRN: [id number] Visit Date: 07/28/2024  Today's healthcare provider: Manuelita Flatness, PA-C   No chief complaint on file.  Subjective    Cameron [name] is a 68 y.o. male who presents today as a new patient to establish care.   Pt was hospitalized from 7/13-7/16, found in his car unresponsive. Elevated temp, creat, cpk and lactic acid. MRI brain negative. Tx for AKI, rhabdo. Stool + ecoli and giardia. Pt was tx w/ flagyl .   Dx acute encephalopathy 2/2 heatstroke. Found folic acid  deficiency.   Past Medical History:  Diagnosis Date   Anxiety    Back pain    Hard of hearing    Hypertension    Migraine    Seasonal allergies    Past Surgical History:  Procedure Laterality Date   FACIAL FRACTURE SURGERY     FOOT FRACTURE SURGERY     FRACTURE SURGERY  2008   L foot fx   KNEE SURGERY     No family status information on file.   No family history on file. Social History   Socioeconomic History   Marital status: Single    Spouse name: Not on file   Number of children: Not on file   Years of education: Not on file   Highest education level: Not on file  Occupational History   Not on file  Tobacco Use   Smoking status: Every Day    Current packs/day: 0.50    Types: Cigarettes   Smokeless tobacco: Never  Substance and Sexual Activity   Alcohol use: No   Drug use: No   Sexual activity: Not on file  Other Topics Concern   Not on file  Social History Narrative   Not on file   Social Drivers of Health   Financial Resource Strain: Not on file  Food Insecurity: No Food Insecurity (06/30/2024)   Hunger Vital Sign    Worried About Running Out of Food in the Last Year: Never true    Ran Out of Food in the Last Year: Never true  Transportation Needs: No Transportation Needs (06/30/2024)   PRAPARE - Administrator, Civil Service (Medical): No    Lack of Transportation (Non-Medical):  No  Physical Activity: Not on file  Stress: Not on file  Social Connections: Patient Declined (06/30/2024)   Social Connection and Isolation Panel    Frequency of Communication with Friends and Family: Patient declined    Frequency of Social Gatherings with Friends and Family: Patient declined    Attends Religious Services: Patient declined    Database administrator or Organizations: Patient declined    Attends Banker Meetings: Patient declined    Marital Status: Patient declined   Outpatient Medications Prior to Visit  Medication Sig   amLODipine  (NORVASC ) 10 MG tablet Take 1 tablet (10 mg total) by mouth daily.   folic acid  (FOLVITE ) 1 MG tablet Take 1 tablet (1 mg total) by mouth daily.   potassium chloride  SA (KLOR-CON  M) 20 MEQ tablet Take 1 tablet (20 mEq total) by mouth daily for 7 days.   No facility-administered medications prior to visit.   Allergies  Allergen Reactions   Penicillins Shortness Of Breath and Swelling    Has patient had a PCN reaction causing immediate rash, facial/tongue/throat swelling, SOB or lightheadedness with hypotension: yes Has patient had a PCN reaction causing severe  rash involving mucus membranes or skin necrosis: no Has patient had a PCN reaction that required hospitalization: yes,patient  was admitted   Did a PCN reaction occurring within the last 10 years: no If all of the above answers are NO, then may proceed with Cephalosporin use.      There is no immunization history on file for this patient.  Health Maintenance  Topic Date Due   Medicare Annual Wellness (AWV)  Never done   Hepatitis C Screening  Never done   DTaP/Tdap/Td (1 - Tdap) Never done   Pneumococcal Vaccine: 50+ Years (1 of 2 - PCV) Never done   Colonoscopy  Never done   Lung Cancer Screening  Never done   Zoster Vaccines- Shingrix (1 of 2) Never done   COVID-19 Vaccine (1 - 2024-25 season) Never done   INFLUENZA VACCINE  07/17/2024   Hepatitis B  Vaccines  Aged Out   HPV VACCINES  Aged Out   Meningococcal B Vaccine  Aged Out    Patient Care Team: Health, [address] as PCP - General  Review of Systems  {Insert previous labs (optional):23779} {See past labs  Heme  Chem  Endocrine  Serology  Results Review (optional):1}   Objective    There were no vitals taken for this visit. {Insert last BP/Wt (optional):23777}{See vitals history (optional):1}   Physical Exam ***  Depression Screen    04/15/2019   11:13 AM  PHQ 2/9 Scores  PHQ - 2 Score 0  PHQ- 9 Score 6   No results found for any visits on 07/28/24.  Assessment & Plan     There are no diagnoses linked to this encounter.   No follow-ups on file.      Manuelita Flatness, PA-C  [facility] at [facility] [phone] (phone) [phone] (fax)  [facility]
